# Patient Record
Sex: Female | Born: 1935 | Race: White | Hispanic: No | Smoking: Never smoker
Health system: Southern US, Community
[De-identification: ages and names within clinical notes are randomized; demographics above are authoritative.]

## PROBLEM LIST (undated history)

## (undated) DIAGNOSIS — I10 Essential (primary) hypertension: Secondary | ICD-10-CM

## (undated) DIAGNOSIS — E039 Hypothyroidism, unspecified: Secondary | ICD-10-CM

## (undated) DIAGNOSIS — F329 Major depressive disorder, single episode, unspecified: Secondary | ICD-10-CM

## (undated) DIAGNOSIS — F32A Depression, unspecified: Secondary | ICD-10-CM

## (undated) DIAGNOSIS — F419 Anxiety disorder, unspecified: Secondary | ICD-10-CM

## (undated) DIAGNOSIS — M1990 Unspecified osteoarthritis, unspecified site: Secondary | ICD-10-CM

## (undated) DIAGNOSIS — K219 Gastro-esophageal reflux disease without esophagitis: Secondary | ICD-10-CM

## (undated) DIAGNOSIS — G4730 Sleep apnea, unspecified: Secondary | ICD-10-CM

## (undated) DIAGNOSIS — Z5189 Encounter for other specified aftercare: Secondary | ICD-10-CM

## (undated) HISTORY — PX: CHOLECYSTECTOMY: SHX55

## (undated) HISTORY — PX: EYE SURGERY: SHX253

## (undated) HISTORY — PX: ABDOMINAL HYSTERECTOMY: SHX81

## (undated) HISTORY — PX: HERNIA REPAIR: SHX51

## (undated) HISTORY — PX: OTHER SURGICAL HISTORY: SHX169

## (undated) HISTORY — PX: TUBAL LIGATION: SHX77

---
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
## 1991-07-12 HISTORY — PX: OTHER SURGICAL HISTORY: SHX169

## 1993-09-07 HISTORY — PX: OTHER SURGICAL HISTORY: SHX169

## 1998-05-28 ENCOUNTER — Other Ambulatory Visit: Admission: RE | Admit: 1998-05-28 | Discharge: 1998-05-28 | Payer: Self-pay | Admitting: Gynecology

## 1999-07-07 ENCOUNTER — Other Ambulatory Visit: Admission: RE | Admit: 1999-07-07 | Discharge: 1999-07-07 | Payer: Self-pay | Admitting: Gynecology

## 2000-07-07 ENCOUNTER — Other Ambulatory Visit: Admission: RE | Admit: 2000-07-07 | Discharge: 2000-07-07 | Payer: Self-pay | Admitting: Gynecology

## 2001-07-09 ENCOUNTER — Other Ambulatory Visit: Admission: RE | Admit: 2001-07-09 | Discharge: 2001-07-09 | Payer: Self-pay | Admitting: Gynecology

## 2001-07-17 ENCOUNTER — Encounter: Payer: Self-pay | Admitting: Gynecology

## 2001-07-17 ENCOUNTER — Encounter: Admission: RE | Admit: 2001-07-17 | Discharge: 2001-07-17 | Payer: Self-pay | Admitting: Gynecology

## 2002-05-04 ENCOUNTER — Encounter: Payer: Self-pay | Admitting: Orthopedic Surgery

## 2002-05-04 ENCOUNTER — Encounter: Admission: RE | Admit: 2002-05-04 | Discharge: 2002-05-04 | Payer: Self-pay | Admitting: Orthopedic Surgery

## 2003-08-19 ENCOUNTER — Other Ambulatory Visit: Admission: RE | Admit: 2003-08-19 | Discharge: 2003-08-19 | Payer: Self-pay | Admitting: Gynecology

## 2003-09-05 ENCOUNTER — Encounter: Admission: RE | Admit: 2003-09-05 | Discharge: 2003-09-05 | Payer: Self-pay | Admitting: Gastroenterology

## 2003-09-09 ENCOUNTER — Encounter: Admission: RE | Admit: 2003-09-09 | Discharge: 2003-09-09 | Payer: Self-pay | Admitting: Gastroenterology

## 2004-01-09 ENCOUNTER — Inpatient Hospital Stay (HOSPITAL_COMMUNITY): Admission: RE | Admit: 2004-01-09 | Discharge: 2004-01-12 | Payer: Self-pay | Admitting: Surgery

## 2004-01-09 HISTORY — PX: HIATAL HERNIA REPAIR: SHX195

## 2004-03-30 ENCOUNTER — Encounter: Admission: RE | Admit: 2004-03-30 | Discharge: 2004-03-30 | Payer: Self-pay | Admitting: Neurosurgery

## 2004-07-02 ENCOUNTER — Encounter: Admission: RE | Admit: 2004-07-02 | Discharge: 2004-07-02 | Payer: Self-pay | Admitting: Neurosurgery

## 2004-08-23 ENCOUNTER — Encounter: Admission: RE | Admit: 2004-08-23 | Discharge: 2004-08-23 | Payer: Self-pay | Admitting: Neurosurgery

## 2004-09-07 ENCOUNTER — Encounter: Admission: RE | Admit: 2004-09-07 | Discharge: 2004-09-07 | Payer: Self-pay | Admitting: Neurosurgery

## 2004-09-08 ENCOUNTER — Encounter: Admission: RE | Admit: 2004-09-08 | Discharge: 2004-09-08 | Payer: Self-pay | Admitting: Gynecology

## 2004-09-21 ENCOUNTER — Encounter: Admission: RE | Admit: 2004-09-21 | Discharge: 2004-09-21 | Payer: Self-pay | Admitting: Neurosurgery

## 2004-09-22 ENCOUNTER — Encounter: Admission: RE | Admit: 2004-09-22 | Discharge: 2004-09-22 | Payer: Self-pay | Admitting: Gynecology

## 2004-10-30 ENCOUNTER — Emergency Department (HOSPITAL_COMMUNITY): Admission: EM | Admit: 2004-10-30 | Discharge: 2004-10-30 | Payer: Self-pay | Admitting: Emergency Medicine

## 2004-11-08 ENCOUNTER — Encounter: Admission: RE | Admit: 2004-11-08 | Discharge: 2004-11-08 | Payer: Self-pay | Admitting: Neurosurgery

## 2005-03-23 ENCOUNTER — Encounter: Admission: RE | Admit: 2005-03-23 | Discharge: 2005-03-23 | Payer: Self-pay | Admitting: Gynecology

## 2005-05-27 HISTORY — PX: BACK SURGERY: SHX140

## 2005-06-28 ENCOUNTER — Encounter: Admission: RE | Admit: 2005-06-28 | Discharge: 2005-07-21 | Payer: Self-pay | Admitting: *Deleted

## 2005-07-22 ENCOUNTER — Encounter: Admission: RE | Admit: 2005-07-22 | Discharge: 2005-08-16 | Payer: Self-pay | Admitting: *Deleted

## 2005-08-17 ENCOUNTER — Encounter: Admission: RE | Admit: 2005-08-17 | Discharge: 2005-09-13 | Payer: Self-pay | Admitting: *Deleted

## 2005-09-14 ENCOUNTER — Encounter: Admission: RE | Admit: 2005-09-14 | Discharge: 2005-10-04 | Payer: Self-pay | Admitting: *Deleted

## 2005-11-02 ENCOUNTER — Other Ambulatory Visit: Admission: RE | Admit: 2005-11-02 | Discharge: 2005-11-02 | Payer: Self-pay | Admitting: Gynecology

## 2005-11-08 ENCOUNTER — Encounter: Admission: RE | Admit: 2005-11-08 | Discharge: 2005-11-08 | Payer: Self-pay | Admitting: Gynecology

## 2006-05-10 ENCOUNTER — Encounter: Admission: RE | Admit: 2006-05-10 | Discharge: 2006-05-10 | Payer: Self-pay | Admitting: Gynecology

## 2006-08-09 IMAGING — CR DG LUMBAR SPINE COMPLETE 4+V
5 series · 5 of 5 positions shown · non-contrast
Comparison: 03/30/04.

CLINICAL DATA: Lumbar pain.  Assess for possible fracture at L-2.
 LUMBAR SPINE ? 4 VIEW:

[view not recorded (1 of 5)]
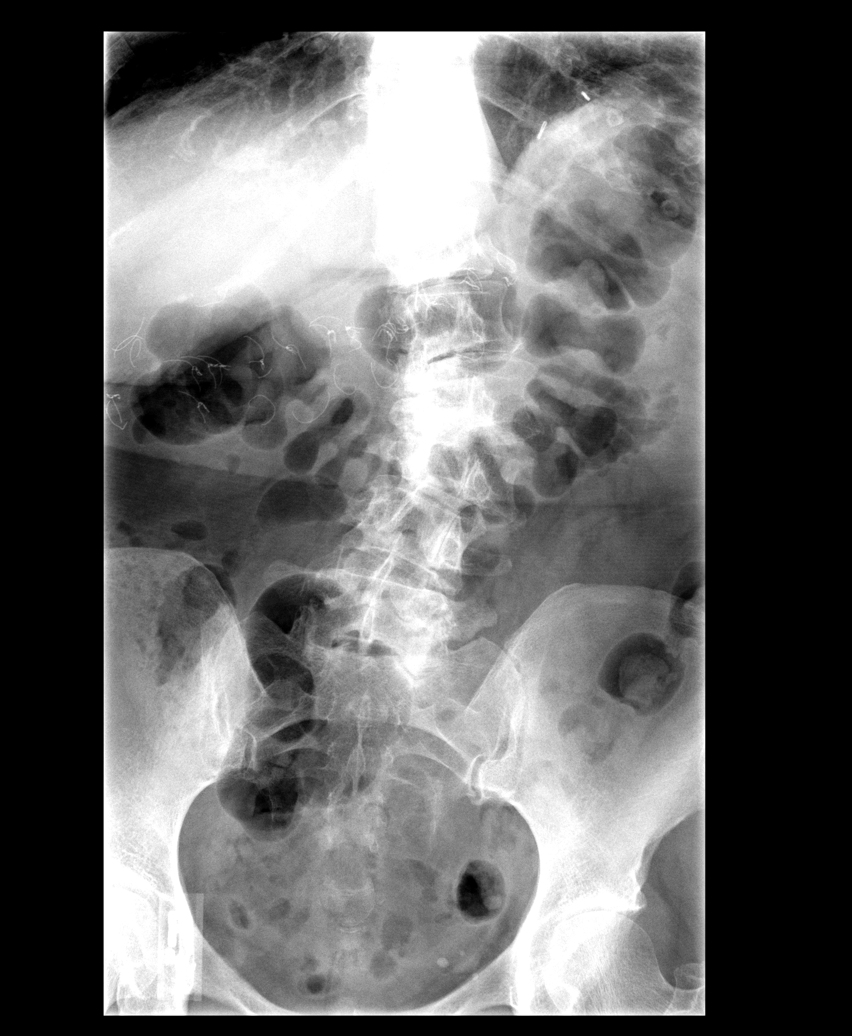

[view not recorded (2 of 5)]
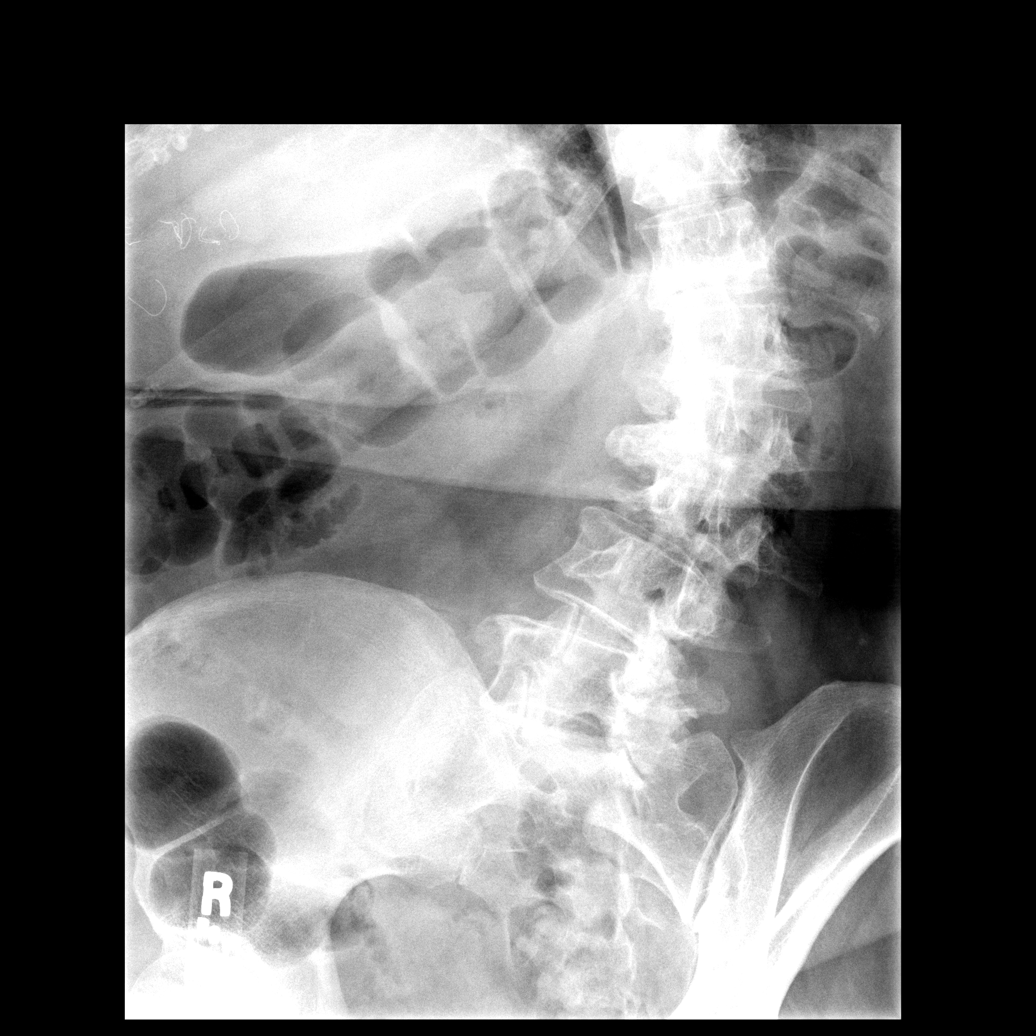

[view not recorded (3 of 5)]
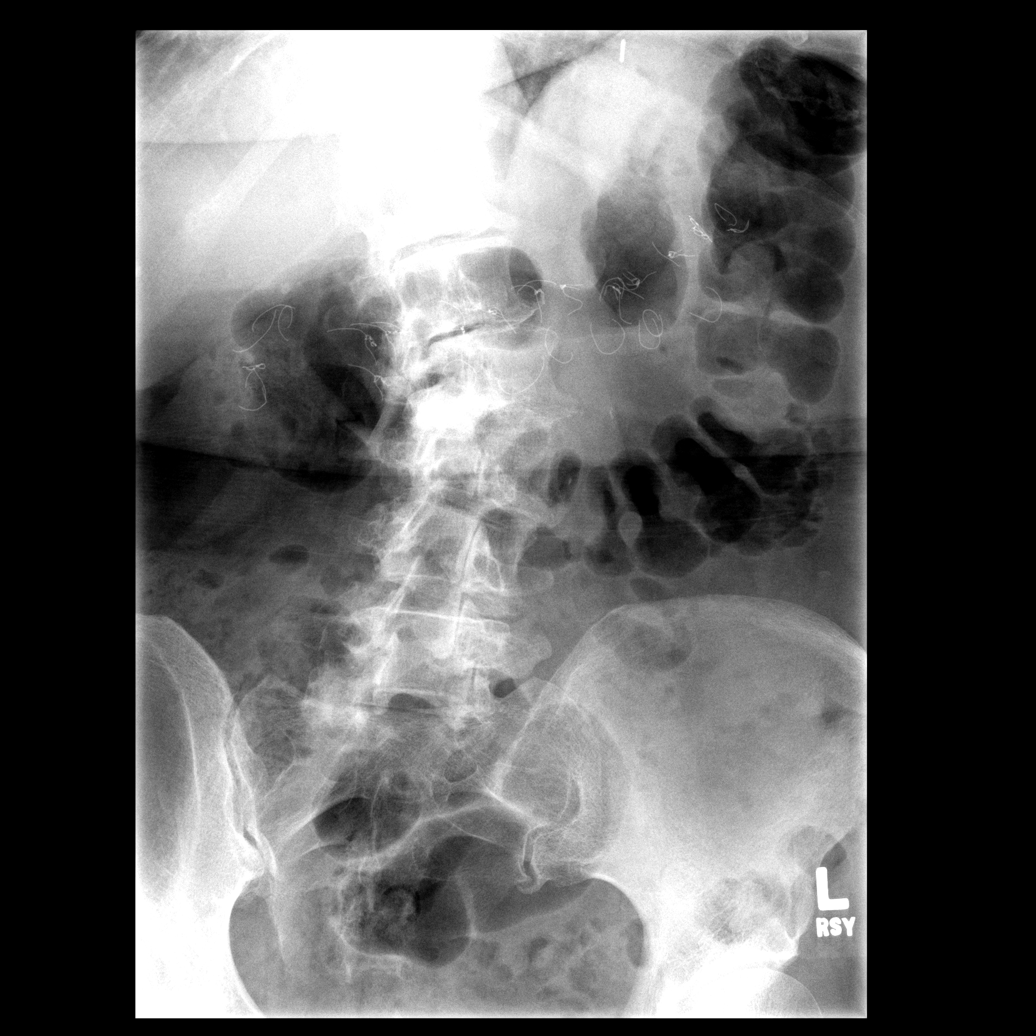

[view not recorded (4 of 5)]
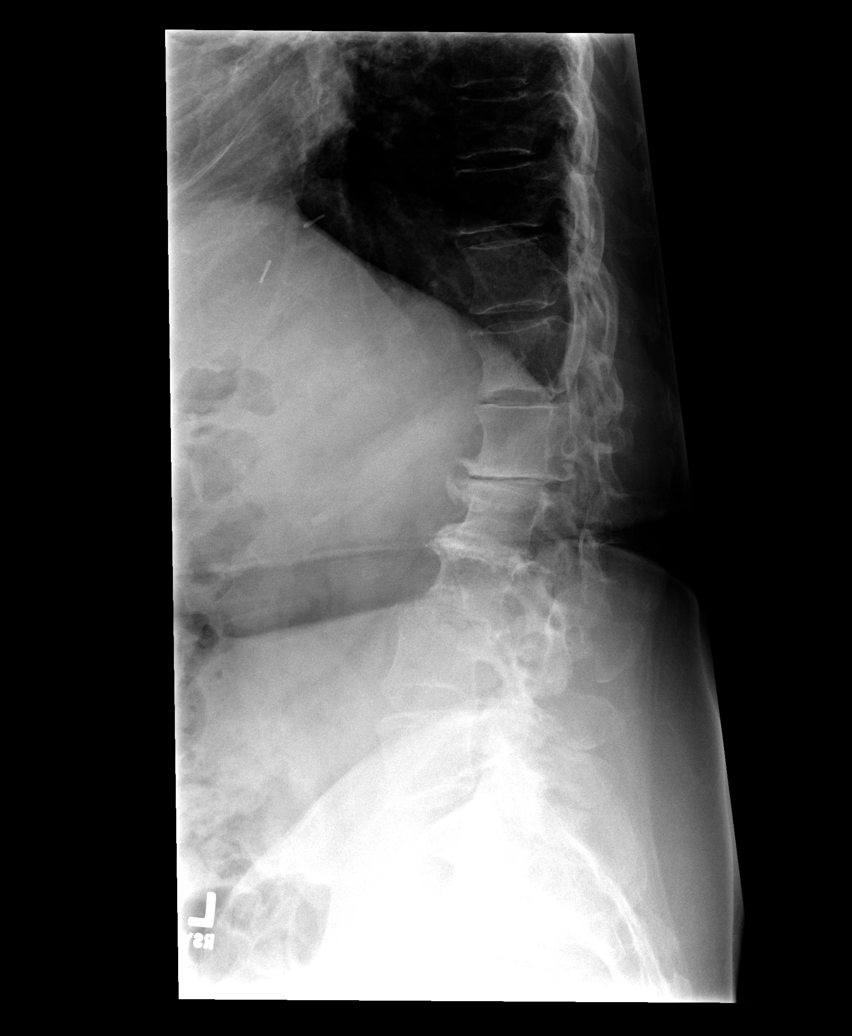

[view not recorded (5 of 5)]
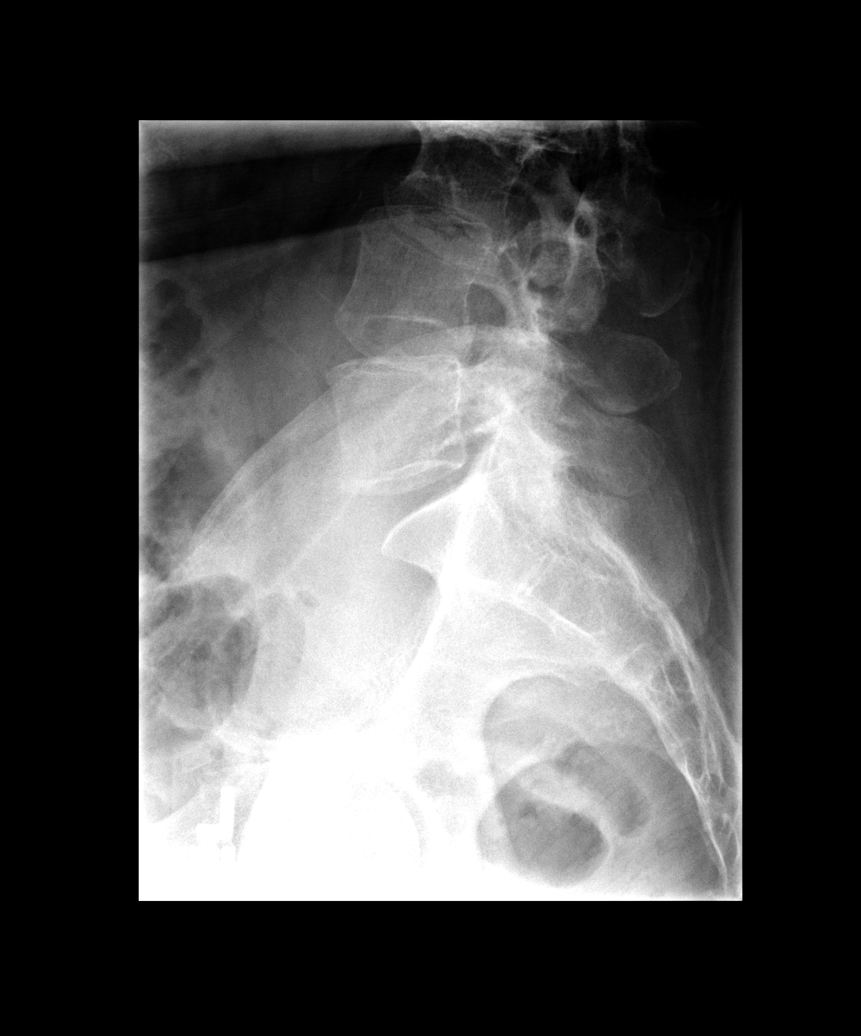

[5 of 5 positions shown; findings below may reference images not displayed]

FINDINGS: There is scoliosis of the thoracolumbar spine convex to the left with the apex at L-2.  There is degenerative disc disease at T12-L1, L1-2, L2-3 and L3-4 with loss of disc space particularly on the right at L1-2 and L2-3 because of the scoliosis.  I do not see that there is an actual fracture.  In the lower lumbar spine, degenerative changes are present of a lesser degree, more pronounced on the left because of the curvature.
IMPRESSION: No fracture demonstrated.  Thoracolumbar scoliosis with pronounced degenerative changes on the right at L1-2 and L2-3.  See above for full discussion.

## 2006-10-06 ENCOUNTER — Ambulatory Visit: Payer: Self-pay | Admitting: Gastroenterology

## 2006-10-18 ENCOUNTER — Encounter: Payer: Self-pay | Admitting: Gastroenterology

## 2006-10-18 LAB — CONVERTED CEMR LAB
Fecal Occult Blood: NEGATIVE
OCCULT 1: NEGATIVE
OCCULT 2: NEGATIVE
OCCULT 3: NEGATIVE
OCCULT 5: NEGATIVE

## 2006-11-01 ENCOUNTER — Ambulatory Visit: Payer: Self-pay | Admitting: Gastroenterology

## 2006-11-01 DIAGNOSIS — K449 Diaphragmatic hernia without obstruction or gangrene: Secondary | ICD-10-CM | POA: Insufficient documentation

## 2006-11-01 DIAGNOSIS — K644 Residual hemorrhoidal skin tags: Secondary | ICD-10-CM | POA: Insufficient documentation

## 2006-11-01 DIAGNOSIS — K219 Gastro-esophageal reflux disease without esophagitis: Secondary | ICD-10-CM

## 2006-11-01 DIAGNOSIS — K5730 Diverticulosis of large intestine without perforation or abscess without bleeding: Secondary | ICD-10-CM | POA: Insufficient documentation

## 2006-11-14 ENCOUNTER — Encounter: Admission: RE | Admit: 2006-11-14 | Discharge: 2006-11-14 | Payer: Self-pay | Admitting: Internal Medicine

## 2006-12-25 ENCOUNTER — Ambulatory Visit (HOSPITAL_COMMUNITY): Admission: RE | Admit: 2006-12-25 | Discharge: 2006-12-25 | Payer: Self-pay | Admitting: Internal Medicine

## 2007-03-23 ENCOUNTER — Encounter (HOSPITAL_COMMUNITY): Admission: RE | Admit: 2007-03-23 | Discharge: 2007-06-21 | Payer: Self-pay | Admitting: Internal Medicine

## 2007-03-23 DIAGNOSIS — K222 Esophageal obstruction: Secondary | ICD-10-CM

## 2007-03-23 DIAGNOSIS — R1310 Dysphagia, unspecified: Secondary | ICD-10-CM | POA: Insufficient documentation

## 2007-03-23 DIAGNOSIS — R1013 Epigastric pain: Secondary | ICD-10-CM

## 2007-06-25 ENCOUNTER — Encounter (HOSPITAL_COMMUNITY): Admission: RE | Admit: 2007-06-25 | Discharge: 2007-09-23 | Payer: Self-pay | Admitting: Internal Medicine

## 2007-10-10 ENCOUNTER — Encounter (HOSPITAL_COMMUNITY): Admission: RE | Admit: 2007-10-10 | Discharge: 2008-01-08 | Payer: Self-pay | Admitting: Internal Medicine

## 2008-01-11 DIAGNOSIS — Z5189 Encounter for other specified aftercare: Secondary | ICD-10-CM

## 2008-01-11 DIAGNOSIS — IMO0001 Reserved for inherently not codable concepts without codable children: Secondary | ICD-10-CM

## 2008-01-11 HISTORY — PX: BREAST SURGERY: SHX581

## 2008-01-11 HISTORY — DX: Reserved for inherently not codable concepts without codable children: IMO0001

## 2008-01-11 HISTORY — DX: Encounter for other specified aftercare: Z51.89

## 2008-02-01 ENCOUNTER — Encounter: Admission: RE | Admit: 2008-02-01 | Discharge: 2008-02-01 | Payer: Self-pay | Admitting: Gynecology

## 2008-02-15 ENCOUNTER — Encounter: Payer: Self-pay | Admitting: Gastroenterology

## 2008-02-20 ENCOUNTER — Encounter: Payer: Self-pay | Admitting: Gastroenterology

## 2008-03-11 ENCOUNTER — Telehealth: Payer: Self-pay | Admitting: Gastroenterology

## 2008-03-12 ENCOUNTER — Encounter: Payer: Self-pay | Admitting: Nurse Practitioner

## 2008-03-12 ENCOUNTER — Ambulatory Visit: Payer: Self-pay | Admitting: Gastroenterology

## 2008-03-12 ENCOUNTER — Encounter: Payer: Self-pay | Admitting: Gastroenterology

## 2008-03-12 DIAGNOSIS — K5900 Constipation, unspecified: Secondary | ICD-10-CM | POA: Insufficient documentation

## 2008-03-12 DIAGNOSIS — I10 Essential (primary) hypertension: Secondary | ICD-10-CM | POA: Insufficient documentation

## 2008-03-12 DIAGNOSIS — J449 Chronic obstructive pulmonary disease, unspecified: Secondary | ICD-10-CM

## 2008-03-12 DIAGNOSIS — R1319 Other dysphagia: Secondary | ICD-10-CM | POA: Insufficient documentation

## 2008-03-14 ENCOUNTER — Encounter: Payer: Self-pay | Admitting: Gastroenterology

## 2008-03-17 ENCOUNTER — Ambulatory Visit: Payer: Self-pay | Admitting: Gastroenterology

## 2008-03-18 ENCOUNTER — Encounter: Payer: Self-pay | Admitting: Gastroenterology

## 2008-03-25 ENCOUNTER — Encounter: Payer: Self-pay | Admitting: Gastroenterology

## 2008-04-01 ENCOUNTER — Encounter (HOSPITAL_COMMUNITY): Admission: RE | Admit: 2008-04-01 | Discharge: 2008-05-15 | Payer: Self-pay | Admitting: Internal Medicine

## 2008-04-14 ENCOUNTER — Encounter: Admission: RE | Admit: 2008-04-14 | Discharge: 2008-04-14 | Payer: Self-pay | Admitting: Surgery

## 2008-04-15 ENCOUNTER — Encounter (INDEPENDENT_AMBULATORY_CARE_PROVIDER_SITE_OTHER): Payer: Self-pay | Admitting: Surgery

## 2008-04-15 ENCOUNTER — Ambulatory Visit (HOSPITAL_BASED_OUTPATIENT_CLINIC_OR_DEPARTMENT_OTHER): Admission: RE | Admit: 2008-04-15 | Discharge: 2008-04-15 | Payer: Self-pay | Admitting: Surgery

## 2008-04-29 ENCOUNTER — Ambulatory Visit: Payer: Self-pay | Admitting: Gastroenterology

## 2008-05-07 ENCOUNTER — Ambulatory Visit (HOSPITAL_COMMUNITY): Admission: RE | Admit: 2008-05-07 | Discharge: 2008-05-07 | Payer: Self-pay | Admitting: Gastroenterology

## 2008-05-07 ENCOUNTER — Encounter: Payer: Self-pay | Admitting: Gastroenterology

## 2008-05-08 ENCOUNTER — Telehealth: Payer: Self-pay | Admitting: Gastroenterology

## 2008-06-18 ENCOUNTER — Ambulatory Visit: Payer: Self-pay | Admitting: Gastroenterology

## 2008-06-24 ENCOUNTER — Encounter (HOSPITAL_COMMUNITY): Admission: RE | Admit: 2008-06-24 | Discharge: 2008-09-22 | Payer: Self-pay | Admitting: Internal Medicine

## 2008-09-22 ENCOUNTER — Encounter (HOSPITAL_COMMUNITY): Admission: RE | Admit: 2008-09-22 | Discharge: 2008-10-09 | Payer: Self-pay | Admitting: Internal Medicine

## 2008-10-02 ENCOUNTER — Encounter: Payer: Self-pay | Admitting: Gastroenterology

## 2008-12-23 ENCOUNTER — Encounter (HOSPITAL_COMMUNITY): Admission: RE | Admit: 2008-12-23 | Discharge: 2009-01-08 | Payer: Self-pay | Admitting: Internal Medicine

## 2009-02-03 ENCOUNTER — Encounter: Admission: RE | Admit: 2009-02-03 | Discharge: 2009-02-03 | Payer: Self-pay | Admitting: Gynecology

## 2009-03-16 ENCOUNTER — Encounter (HOSPITAL_COMMUNITY): Admission: RE | Admit: 2009-03-16 | Discharge: 2009-06-14 | Payer: Self-pay | Admitting: Internal Medicine

## 2010-01-31 ENCOUNTER — Encounter: Payer: Self-pay | Admitting: Gynecology

## 2010-04-21 LAB — BASIC METABOLIC PANEL
BUN: 9 mg/dL (ref 6–23)
CO2: 28 mEq/L (ref 19–32)
Calcium: 10 mg/dL (ref 8.4–10.5)
GFR calc non Af Amer: >60 mL/min (ref >60)
Glucose, Bld: 115 mg/dL — ABNORMAL HIGH (ref 70–99)
Potassium: 4.4 mEq/L (ref 3.5–5.1)

## 2010-05-25 NOTE — Assessment & Plan Note (Signed)
[name]                         GASTROENTEROLOGY OFFICE NOTE   NAME:Meghan Sexton, Meghan Sexton                     MRN:          [id number]  DATE:10/06/2006                            DOB:          13-Aug-1923    This is an 77 year old white female previously followed by Dr. Victorino Dike with a long history of recurrent esophageal strictures.  She has  had multiple dilations over the years.  She underwent repair of a large  paraesophageal hernia in December, 2005.  She has not had a repeat  endoscopy since that time.   Over the past six months, she has noted dysphagia and odynophagia to  both solids and liquids with occasional epigastric cramping.  She has  noted dark, black-colored stools for several weeks.  She was seen by Dr.  Timothy Lasso and was prescribed Prevacid for about two weeks, and her stools  returned to a normal brown color.  CBC from September 21, 2006 showed a  normal hemoglobin at 11.3 and a white blood cell count of 7.4.   Her last colonoscopy was performed in 1997, showing only diverticulosis.  She notes no change in stool caliber or weight loss.  She takes Actonel  on a weekly basis.   FAMILY HISTORY:  Negative for colon cancer, colon polyps, and  inflammatory bowel disease.   CURRENT MEDICATIONS:  Listed on the chart, updated and reviewed.   MEDICATION ALLERGIES:  PENICILLIN, SULFA DRUGS.   PHYSICAL EXAMINATION:  No acute distress.  Weight 145.6 pounds.  Blood pressure 138/66.  Pulse 72 and regular.  HEENT:  Anicteric sclerae.  Oropharynx clear.  CHEST:  Clear to auscultation bilaterally.  CARDIAC:  Regular rate and rhythm without murmurs appreciated.  ABDOMEN:  Soft with minimal epigastric tenderness to deep palpation.  No  rebound or guarding.  No palpable organomegaly, masses, or hernias.  Normoactive bowel sounds.  RECTAL:  Patient declined, saying she would prefer to have this done at  the time of colonoscopy.  NEUROLOGIC:   Alert and oriented x3.  Grossly nonfocal.   ASSESSMENT/PLAN:  Dysphagia, odynophagia, epigastric pain, and a  questionable history of melena.  Rule out strictures and esophagitis  secondary to acid reflux or Actonel.  Rule out ulcer disease and  colorectal lesions leading to bleeding.  She is to discontinue Actonel  and begin Prevacid 30 mg p.o. q.a.m.  Obtain stool Hemoccults.  Risks,  benefits and alternatives to upper endoscopy with possible biopsy and  possible dilation and colonoscopy with possible biopsy and  possible polypectomy discussed with the patient, and she consents to  proceed.  The procedures will be scheduled electively.     Venita Lick. Russella Dar, MD, [facility]  Electronically Signed    MTS/MedQ  DD: 10/06/2006  DT: 10/07/2006  Job #: [id number]   cc:   Gwen Pounds, MD

## 2010-05-25 NOTE — Op Note (Signed)
Meghan Sexton, Meghan Sexton              ACCOUNT NO.:  [id number]   MEDICAL RECORD NO.:  [id number]          PATIENT TYPE:  AMB   LOCATION:  DSC                          FACILITY:  [facility]   PHYSICIAN:  Currie Paris, M.D.DATE OF BIRTH:  Nov 29, 1923   DATE OF PROCEDURE:  04/15/2008  DATE OF DISCHARGE:                               OPERATIVE REPORT   PREOPERATIVE DIAGNOSIS:  Palpable mass, right breast subareolar.   POSTOPERATIVE DIAGNOSIS:  Palpable mass, right breast subareolar.   OPERATION:  Excision of right breast mass.   SURGEON:  Currie Paris, MD   ANESTHESIA:  MAC.   CLINICAL HISTORY:  This is an 84-year lady with palpable abnormalities  that developed recently in the right breast subareolarly.  It was not  visible on imaging studies and felt likely benign but clearly different  than what it had been in the past by her primary physicians.  After  discussion with the patient, we elected to excise this under MAC  anesthesia.   DESCRIPTION OF PROCEDURE:  I saw the patient in the holding area, and  she had no further questions.  We confirmed and marked the right breast  as the operative side.   The patient was taken to the operating room and with her position, I was  able to palpate the exact mass and mark it.  She was then given IV  sedation and the breast was prepped and draped.  The time-out was done.   I used a combination of 0.5% Marcaine with epi and 1% Xylocaine plain  mixed equally for local and infiltrated along the edge of the areola,  subareolarly, and under the mass.  I made a curvilinear incision at the  areolar margin.   There was fatty tissue present but also some dense white fibrous tissue  which represented the palpable mass.  Clinically, it appeared to be some  residual breast tissue.  I excised that in toto.   I made sure everything was dry and then closed with 3-0 Vicryl, 4-0  Monocryl subcuticular, and Dermabond.   The patient tolerated the  procedure well, and there were no  complications.  All counts were correct.      Currie Paris, M.D.  Electronically Signed     CJS/MEDQ  D:  04/15/2008  T:  04/15/2008  Job:  [id number]   cc:   Gwen Pounds, MD  Gretta Cool, M.D.

## 2010-05-28 NOTE — Op Note (Signed)
NAMEROCHELLE, LARUE              ACCOUNT NO.:  [id number]   MEDICAL RECORD NO.:  [id number]          PATIENT TYPE:  INP   LOCATION:  0001                         FACILITY:  [facility]   PHYSICIAN:  Thornton Park. Daphine Deutscher, MD  DATE OF BIRTH:  04-02-1923   DATE OF PROCEDURE:  01/09/2004  DATE OF DISCHARGE:                                 OPERATIVE REPORT   PREOPERATIVE INDICATIONS:  Type 3 mixed large hiatal hernia.   PROCEDURE:  Laparoscopic repair of large type 3 mixed hiatal hernia with  pledgeted closure of the diaphragm and with Nissen fundoplication over a #50  dilator.   SURGEON:  Luretha Murphy, M.D.   ASSISTANT:  Gita Kudo, M.D.   ANESTHESIA:  General endotracheal anesthesia.   ESTIMATED BLOOD LOSS:  50 mL.   COMPLICATIONS:  Without.   DISPOSITION:  To ICU stepdown.   DESCRIPTION OF PROCEDURE:  Dunia Pringle is a 77 year old lady given  general anesthesia and prepped with Betadine and draped sterilely.  A Foley  catheter was inserted and entered the abdomen through the left upper  quadrant using a 0 degree 10 mm scope and Optiview technique.  This was done  without difficulty and the abdomen was insufflated.  Following insufflation,  two 10-11 were placed over in the right upper quadrant after it took down a  numerous adhesions from a previous old open cholecystectomy and she had a  lower midline incision with ventral hernia repair.  A 10-11 was placed below  the umbilicus and then a 5 mm in the upper midline for the articulating  retractor for the liver.  Once in, the liver was retracted revealing a large  hiatal hernia with the stomach up in the chest.  I grasped the stomach,  pulled it down and then using the harmonic staple dissected both the right  and left pruce and dissected the hernia sac off with freeing up the stomach  from this.  I carried this anteriorly and freed this and then I got Dr.  Rica Mast to pass a lighted bougie to help identify the esophagus  and  esophagogastric junction and with this I was able to do a retroesophageal  dissection eventually getting the large finger retractor around the  esophagus and the Penrose drain for subsequent retraction.  With that in  place, we completed the dissection of the esophagogastric junction.  I did a  three suture pledgeted closure of the pleura posteriorly eventually placing  single suture anteriorly.  In the meantime with the Bougie in place, this  completely seemed to close the diaphragm.  With the bougie in place, I then  reached around behind the esophagus and grasped the cardia, brought it  around and then invaginated the esophagus with the wrap of the cardia.  A  two suture fundoplication was performed getting the purchase of esophagus on  the first bite anchoring it to the esophagus, tying this down with an  intracorporeal tie and a second suture also grabbing at the esophagogastric  junction and tying this as well.  There was no enough stomach to perform a  gastropexy distally  and elected not to do that as that would not distort the  stomach and the anatomy significantly.  However, the closure looked good and  the wrap looked good and healthy.  There was essentially no bleeding.  I did  take down the short gastrics of the harmonic.  No bleeding was noted there.  Where I had taken down the adhesions before, I looked and irrigated that as  well and there was no evidence of any enterotomies or bleeding.   The abdomen was deflated and the trocars were removed.  The wounds were  closed with staples.  The patient will be taken to the ICU for observation  mainly because of her 77 year old age.  +     Matt   MBM/MEDQ  D:  01/09/2004  T:  01/09/2004  Job:  [id number]   cc:   Gwen Pounds, MD  Fax: [phone]   Ulyess Mort, M.D. [facility]

## 2010-05-28 NOTE — Consult Note (Signed)
NAMEPLESHETTE, TOMASINI NO.:  [id number]   MEDICAL RECORD NO.:  [id number]          PATIENT TYPE:  INP   LOCATION:  8295                         FACILITY:  [facility]   PHYSICIAN:  Kari Baars, M.D.  DATE OF BIRTH:  July 14, 1923   DATE OF CONSULTATION:  DATE OF DISCHARGE:                                   CONSULTATION   PRIMARY CARE PHYSICIAN:  Gwen Pounds, MD   REQUESTING PHYSICIAN:  Thornton Park. Daphine Deutscher, MD   REASON FOR CONSULTATION:  Medical management, tachy/brady rhythm, post  hiatal hernia repair.   HISTORY OF PRESENT ILLNESS:  Meghan Sexton is a 77 year old white female  patient of Dr. Ferd Hibbs with a history of hypertension, hyperlipidemia, and  hypothyroidism, who underwent a laparoscopic repair of a large hiatal hernia  due to severe reflux symptoms and esophageal motility problems earlier this  morning.  Evidently, postoperatively she had a tachy/brady rhythm.  Recorded  heart rates are in the 60s to 120s, per the anesthesia record.  She has been  transferred to the intensive care unit due to her rhythm disturbance and for  monitoring.  Since admission here, she has had no bradycardia or tachycardia  noted.  In talking with the patient, she is doing well postoperatively.  She  denies any problems.  Her pain is adequately controlled.  She does report a  history of palpitations intermittently in the past, which was related, she  thinks, to glucosamine use.  She has not had any since that time.  She  denies any history of dysrhythmias.  She denies any recent chest pain,  shortness of breath, dyspnea on exertion.  Otherwise, she is doing well.   REVIEW OF SYSTEMS:  All systems were reviewed and were negative with the  exception of the HPI.   PAST MEDICAL HISTORY:  1.  Hypertension.  2.  Hypothyroidism.  3.  Hyperlipidemia.  4.  Osteoporosis.  5.  Gastroesophageal reflux disease/hiatal hernia with esophageal stricture      requiring dilatation.  6.   Asthma.   HOME MEDICATIONS:  1.  Prilosec 40 mg daily.  2.  Benazepril/HCT 20/12.5 mg daily.  3.  Synthroid 112 mcg daily.  4.  Vivelle 0.05 mg patch 2 times a week.  5.  Desyrel 50 mg q.h.s.  6.  Ultram 50 mg b.i.d.  7.  Actonel 35 mg weekly.  8.  Advair MDI b.i.d.  9.  Atrovent p.r.n.  10. Valium 5 mg 1/2 q.h.s.  11. Lasix 40 mg 1/2 p.r.n.   ALLERGIES:  1.  PENICILLIN.  2.  CODEINE.  3.  SULFA.   SOCIAL HISTORY:  She is a widow.  She does not drink, smoke, or use illicit  drugs.   FAMILY HISTORY:  Negative for coronary artery disease.   PHYSICAL EXAMINATION:  VITAL SIGNS:  Temperature 96.8, blood pressure 90-  112/32-70, heart rate 58-80, oxygen saturation 96% on 2 liters.  GENERAL:  She is in no acute distress, resting comfortably in a supine  position.  HEENT:  Oropharynx is moist.  NECK:  Supple without lymphadenopathy, JVD, or carotid  bruits.  LUNGS:  Clear to auscultation bilaterally.  HEART:  Regular rate and rhythm with a grade 2/6 systolic ejection murmur.  ABDOMEN:  Soft, nontender, nondistended with decreased bowel sounds, which  are present.  She has bandages over her laparoscopic portals.  EXTREMITIES:  No clubbing, cyanosis or edema.  NEUROLOGIC:  Alert and oriented x 4.   LABS:  CBC showed a white count of 11.0, hemoglobin 10.3, platelets 217.  BMET was significant for a potassium of 3.6, bicarb 31, creatinine 0.7, and  a glucose of 143.  Of note, these were obtained postoperatively.   STUDIES:  EKG was reviewed and shows a normal sinus rhythm with nonspecific  ST/T wave changes.   ASSESSMENT/PLAN:  1.  Transient tachycardia/bradycardia rhythm:  This is likely related to her      anesthetics and perhaps pain.  Will monitor her for recurrence on      telemetry.  Will check a TSH.  She has not had any other cardiac      symptoms.  2.  Hypertension:  Hold antihypertensives until her blood pressure increases      to a normal range.  This is currently  decreased due to the narcotics.  3.  Hypothyroidism:  Restart her Synthroid.  Check a TSH.  4.  Asthma:  Continue incentive spirometry and pulmonary toilet following      her surgery.  Will restart her Advair and Atrovent.   I appreciate management of the surgeon in Ms. Doerner' case.  We will gladly  follow along with you during her hospital course and monitor her for any  medical complications following her surgery.  At this time, she seems to be  doing very well.     Margarito Liner   WS/MEDQ  D:  01/09/2004  T:  01/09/2004  Job:  [id number]   cc:   Gwen Pounds, MD  Fax: [phone]

## 2010-05-28 NOTE — Discharge Summary (Signed)
NAMESAI, MOURA              ACCOUNT NO.:  [id number]   MEDICAL RECORD NO.:  [id number]          PATIENT TYPE:  INP   LOCATION:  0463                         FACILITY:  [facility]   PHYSICIAN:  Thornton Park. Daphine Deutscher, MD  DATE OF BIRTH:  [birth year]   DATE OF ADMISSION:  01/09/2004  DATE OF DISCHARGE:  01/12/2004                                 DISCHARGE SUMMARY   ADMISSION DIAGNOSES:  Large paraesophageal hiatal hernia (type 3 mixed).   DISCHARGE DIAGNOSES:  Large paraesophageal hiatal hernia (type 3 mixed).   PROCEDURE:  Laparoscopic repair of large type 3 mixed hiatal hernia with  pledget closure of the diaphragm and Nissen fundoplication over a 50  dilator.   HOSPITAL COURSE:  The patient underwent the above-mentioned operation.  Despite being 77 years old, she did very well.  This was done on 12/30 and  she was ready to go on 1/2.  At that time she was tolerating liquids with a  little pain and having good bowel movements and was ready for discharge.  She was given Darvocet to take as needed for pain and be followed up in the  office in 7-12 days.   CONDITION ON DISCHARGE:  Good.     Matt   MBM/MEDQ  D:  01/28/2004  T:  01/28/2004  Job:  [id number]   cc:   Gwen Pounds, MD  Fax: [phone]   Ulyess Mort, M.D. [facility]

## 2010-10-06 ENCOUNTER — Ambulatory Visit: Payer: Medicare Other | Attending: Internal Medicine | Admitting: Physical Therapy

## 2010-10-06 DIAGNOSIS — R269 Unspecified abnormalities of gait and mobility: Secondary | ICD-10-CM | POA: Insufficient documentation

## 2010-10-06 DIAGNOSIS — M6281 Muscle weakness (generalized): Secondary | ICD-10-CM | POA: Insufficient documentation

## 2010-10-06 DIAGNOSIS — IMO0001 Reserved for inherently not codable concepts without codable children: Secondary | ICD-10-CM | POA: Insufficient documentation

## 2010-10-14 ENCOUNTER — Ambulatory Visit: Payer: Medicare Other | Attending: Internal Medicine | Admitting: Physical Therapy

## 2010-10-14 DIAGNOSIS — M6281 Muscle weakness (generalized): Secondary | ICD-10-CM | POA: Insufficient documentation

## 2010-10-14 DIAGNOSIS — R269 Unspecified abnormalities of gait and mobility: Secondary | ICD-10-CM | POA: Insufficient documentation

## 2010-10-14 DIAGNOSIS — IMO0001 Reserved for inherently not codable concepts without codable children: Secondary | ICD-10-CM | POA: Insufficient documentation

## 2010-10-15 ENCOUNTER — Ambulatory Visit: Payer: Medicare Other | Admitting: Physical Therapy

## 2010-10-18 ENCOUNTER — Ambulatory Visit: Payer: Medicare Other | Admitting: Physical Therapy

## 2010-10-20 ENCOUNTER — Ambulatory Visit: Payer: Medicare Other | Admitting: Physical Therapy

## 2010-10-25 ENCOUNTER — Encounter: Payer: BC Managed Care – PPO | Admitting: Physical Therapy

## 2010-10-28 ENCOUNTER — Ambulatory Visit: Payer: Medicare Other | Admitting: Physical Therapy

## 2010-11-02 ENCOUNTER — Ambulatory Visit: Payer: Medicare Other | Admitting: Physical Therapy

## 2010-11-04 ENCOUNTER — Ambulatory Visit: Payer: Medicare Other | Admitting: Physical Therapy

## 2010-11-08 ENCOUNTER — Ambulatory Visit: Payer: Medicare Other | Admitting: Physical Therapy

## 2010-11-11 ENCOUNTER — Ambulatory Visit: Payer: Medicare Other | Attending: Internal Medicine | Admitting: Physical Therapy

## 2010-11-11 DIAGNOSIS — M6281 Muscle weakness (generalized): Secondary | ICD-10-CM | POA: Insufficient documentation

## 2010-11-11 DIAGNOSIS — IMO0001 Reserved for inherently not codable concepts without codable children: Secondary | ICD-10-CM | POA: Insufficient documentation

## 2010-11-11 DIAGNOSIS — R269 Unspecified abnormalities of gait and mobility: Secondary | ICD-10-CM | POA: Insufficient documentation

## 2010-11-15 ENCOUNTER — Ambulatory Visit: Payer: Medicare Other | Admitting: Physical Therapy

## 2010-11-18 ENCOUNTER — Ambulatory Visit: Payer: Medicare Other | Admitting: Physical Therapy

## 2010-11-22 ENCOUNTER — Ambulatory Visit: Payer: Medicare Other | Admitting: Physical Therapy

## 2010-11-24 ENCOUNTER — Ambulatory Visit: Payer: BC Managed Care – PPO | Admitting: Physical Therapy

## 2010-11-29 ENCOUNTER — Ambulatory Visit: Payer: BC Managed Care – PPO | Admitting: Physical Therapy

## 2010-11-30 ENCOUNTER — Ambulatory Visit: Payer: BC Managed Care – PPO | Admitting: Physical Therapy

## 2010-12-06 ENCOUNTER — Ambulatory Visit: Payer: Medicare Other | Admitting: Physical Therapy

## 2010-12-09 ENCOUNTER — Ambulatory Visit: Payer: BC Managed Care – PPO | Admitting: Physical Therapy

## 2011-03-22 ENCOUNTER — Encounter (HOSPITAL_COMMUNITY): Payer: Self-pay | Admitting: Pharmacy Technician

## 2011-03-28 ENCOUNTER — Encounter (HOSPITAL_COMMUNITY)
Admission: RE | Admit: 2011-03-28 | Discharge: 2011-03-28 | Disposition: A | Discharge status: Home / Self Care | Payer: Medicare Other | Source: Ambulatory Visit | Attending: Orthopedic Surgery | Admitting: Orthopedic Surgery

## 2011-03-28 ENCOUNTER — Ambulatory Visit (HOSPITAL_COMMUNITY)
Admission: RE | Admit: 2011-03-28 | Discharge: 2011-03-28 | Disposition: A | Discharge status: Home / Self Care | Payer: Medicare Other | Source: Ambulatory Visit | Attending: Orthopedic Surgery | Admitting: Orthopedic Surgery

## 2011-03-28 ENCOUNTER — Encounter (HOSPITAL_COMMUNITY): Payer: Self-pay

## 2011-03-28 DIAGNOSIS — Z01812 Encounter for preprocedural laboratory examination: Secondary | ICD-10-CM | POA: Insufficient documentation

## 2011-03-28 DIAGNOSIS — Z01818 Encounter for other preprocedural examination: Secondary | ICD-10-CM | POA: Insufficient documentation

## 2011-03-28 HISTORY — DX: Hypothyroidism, unspecified: E03.9

## 2011-03-28 HISTORY — DX: Sleep apnea, unspecified: G47.30

## 2011-03-28 HISTORY — DX: Essential (primary) hypertension: I10

## 2011-03-28 HISTORY — DX: Encounter for other specified aftercare: Z51.89

## 2011-03-28 LAB — CBC
HCT: 36.7 % (ref 36.0–46.0)
Hemoglobin: 12.3 g/dL (ref 12.0–15.0)
MCH: 31.9 pg (ref 26.0–34.0)
MCHC: 33.5 g/dL (ref 30.0–36.0)
MCV: 95.3 fL (ref 78.0–100.0)

## 2011-03-28 LAB — BASIC METABOLIC PANEL
BUN: 20 mg/dL (ref 6–23)
CO2: 34 mEq/L — ABNORMAL HIGH (ref 19–32)
Chloride: 94 mEq/L — ABNORMAL LOW (ref 96–112)
Glucose, Bld: 104 mg/dL — ABNORMAL HIGH (ref 70–99)
Potassium: 3.9 mEq/L (ref 3.5–5.1)

## 2011-03-28 LAB — URINALYSIS, ROUTINE W REFLEX MICROSCOPIC
Bilirubin Urine: NEGATIVE
Glucose, UA: NEGATIVE mg/dL
Ketones, ur: NEGATIVE mg/dL
Protein, ur: NEGATIVE mg/dL
pH: 6 (ref 5.0–8.0)

## 2011-03-28 LAB — DIFFERENTIAL
Basophils Relative: 1 % (ref 0–1)
Eosinophils Relative: 4 % (ref 0–5)
Monocytes Absolute: 0.7 10*3/uL (ref 0.1–1.0)
Monocytes Relative: 7 % (ref 3–12)
Neutro Abs: 6.6 10*3/uL (ref 1.7–7.7)

## 2011-03-28 NOTE — Pre-Procedure Instructions (Signed)
ekg dr Timothy Lasso 03-15-2011 on chart stopbang results faxed to dr Timothy Lasso, fax confirmation received and placed on chartt

## 2011-03-28 NOTE — Patient Instructions (Addendum)
20 Meghan Sexton  03/28/2011   Your procedure is scheduled on:  04-04-11  Report to [facility] at  0730 AM.  Call this number if you have problems the morning of surgery: [phone]   Remember:   Do not eat food or drink liquids:After Midnight.  .  Take these medicines the morning of surgery with A SIP OF WATER: restasis eye drop, diazepam if needed, fentanyl patch, hydrocodone if needed, atrovent nasal spray, synthroid, pantaprazole   Do not wear jewelry or make up.  Do not wear lotions, powders, or perfumes.Do not wear deodorant.    Do not bring valuables to the hospital.  Contacts, dentures or bridgework may not be worn into surgery.  Leave suitcase in the car. After surgery it may be brought to your room.  For patients admitted to the hospital, checkout time is 11:00 AM the day of discharge.     Special Instructions: [employer] Wash: 1/2 bottle night before surgery and 1/2 bottle morning of surgery.neck down avoid private area, no shaving for 2 days before showers   Please read over the following fact sheets that you were given: MRSA Information  [facility] pre op nurse phone number [phone], call if needed

## 2011-03-28 NOTE — Progress Notes (Signed)
03/28/11 1547  OBSTRUCTIVE SLEEP APNEA  Have you ever been diagnosed with sleep apnea through a sleep study? No  Do you snore loudly (loud enough to be heard through closed doors)?  0  Do you often feel tired, fatigued, or sleepy during the daytime? 1  Has anyone observed you stop breathing during your sleep? 1  Do you have, or are you being treated for high blood pressure? 1  BMI more than 35 kg/m2? 0  Age over 76 years old? 1  Obstructive Sleep Apnea Score 4   Score 4 or greater  Updated health history;Results sent to PCP

## 2011-03-30 NOTE — H&P (Signed)
Meghan, Sexton              ACCOUNT NO.:  [id number]  MEDICAL RECORD NO.:  [id number]  LOCATION:  PADM                         FACILITY:  [facility]  PHYSICIAN:  Madlyn Frankel. Charlann Boxer, M.D.  DATE OF BIRTH:  11/03/1923  DATE OF ADMISSION:  03/28/2011 DATE OF DISCHARGE:  03/28/2011                             HISTORY & PHYSICAL   ADMITTING DIAGNOSIS:  Left knee osteoarthritis.  DATE OF SURGERY:  April 04, 2011  HISTORY OF PRESENT ILLNESS:  This is an 78 year old lady with a long history of degenerative arthritis of her left knee that is now failing conservative treatment.  She has pain both day and night, and difficulty ambulating secondary to this.  After discussion of treatments, benefits, risks, and options, the patient is now scheduled for total knee arthroplasty of the left knee.  Note that her medical doctor is Dr. Gwen Pounds, MD.  She is a candidate for tranexamic acid and will receive that preop.  She is also a candidate for dexamethasone and will receive that as well.  She plans on going to a nursing facility after surgery and wants to go to [facility] for her rehab.  She is not given her home medications today.  The surgery risks, benefits, and aftercare were discussed in detail with the patient.  Questions invited and answered.  PAST MEDICAL HISTORY:  Drug allergies to PENICILLIN with erythema nodosa, and SULFA with nausea and vomiting.  CURRENT MEDICATIONS:  Include: 1. Benazepril hydrochlorothiazide 20/12.5 one daily. 2. Synthroid 125 mcg 1 daily. 3. Trazodone 50 mg 1 q.h.s. 4. Pulmicort Flexhaler 180 mcg 2 puffs daily. 5. Atrovent nasal spray 0.6% 1 q.a.m. 6. Diazepam 2 mg 1 p.r.n. spasm. 7. Furosemide 20 mg 1 or 2 tablets p.r.n. swelling. 8. Meloxicam 15 mg 1 daily. 9. Methocarbamol 500 mg 1 b.i.d. p.r.n. spasm. 10.Aspirin 81 mg 1 daily. 11.MiraLax daily. 12.Neurontin 100 mg 1 b.i.d. 13.Fentanyl 50 mcg per hour patch, 1 patch every 3 days. 14.HyoMax SL 0.125  mg 1 or 2 under the tongue p.r.n. 15.Quinine sulfate 325 mg 1 capsule at bedtime as needed for cramps. 16.Protonix 40 mg 1 b.i.d. 17.Restasis 0.05% ophthalmic drops, 1 drop in each eye b.i.d. 18.Hydrocodone 5/500, 3 tablets q.6 h. p.r.n. pain. 19.Multivitamins.  MEDICAL ILLNESSES:  Include arthritis, hypothyroidism, hypertension, hiatal hernia, osteoporosis, allergies, and chronic pain secondary to multiple back surgeries.  PREVIOUS SURGERIES:  Include 3 scoliosis operations, tonsillectomy, suspension of uterus and appendectomy, tubal ligation, inguinal hernia repair, cholecystectomy, ovarian cystectomy, rotator cuff repair, bilateral cataracts, hiatal hernia repair, and lump removed from right breast which was benign.  FAMILY HISTORY:  Positive for pneumonia, arthritis, high blood pressure, myocardial infarction, breast cancer, and kidney cancer.  SOCIAL HISTORY:  The patient is widowed.  She is retired.  She does not smoke.  Does not drink, and again plans on going to [facility] postoperatively for rehabilitation.  REVIEW OF SYSTEMS:  CENTRAL NERVOUS SYSTEM:  Positive for mild hearing loss and balance problems, and tinnitus.  PULMONARY:  Positive for exertional shortness of breath, occasional wheezing secondary to her asthma and allergies.  CARDIOVASCULAR:  Negative for chest pain or palpitation.  GI:  Positive for constipation,  heartburn, and occasional difficulty swallowing.  GU:  Negative for urinary tract difficulty. MUSCULOSKELETAL:  Positive in HPI.  PHYSICAL EXAMINATION:  VITAL SIGNS:  BP 107/55, pulse 76 and regular, respirations 14. HEENT:  Head normocephalic.  Nose patent.  Ears patent.  Pupils are equal, round, and reactive to light.  Throat without injection. NECK:  Supple without adenopathy.  Carotids 2+ without bruit. CHEST:  Clear to auscultation.  No rales or rhonchi.  Respirations 16. HEART:  Regular rate and rhythm at 76 beats per minute with a  1/6 systolic ejection murmur. ABDOMEN:  Soft with active bowel sounds.  No masses or organomegaly. NEUROLOGIC:  The patient alert and oriented to time, place, and person. Cranial nerves 2 through 12 grossly intact. EXTREMITIES:  Shows the left knee with a valgus deformity, 5-degree flexion traction and further flexion to 110 degrees with pain.  She also has prominent thoracolumbar spine hardware from her previous surgeries for her scoliosis.  She does have intermittent tingling in her lower extremity secondary to her multiple back surgeries, and is on neurontin for that.  Her dorsalis pedis, posterior tibialis pulses are 1+.  ASSESSMENT:  End-stage osteoarthritis, left knee.  PLAN:  Total knee arthroplasty, left knee.     Meghan Sexton. Meghan Sexton, P.A.   ______________________________ Madlyn Frankel Charlann Boxer, M.D.    SJC/MEDQ  D:  03/30/2011  T:  03/30/2011  Job:  [id number]

## 2011-03-30 NOTE — Progress Notes (Signed)
H&P performed 03/30/2011 Dictation # [id number]

## 2011-04-04 ENCOUNTER — Encounter (HOSPITAL_COMMUNITY)
Admission: RE | Disposition: A | Discharge status: Skilled Nursing Facility | Payer: Self-pay | Source: Ambulatory Visit | Attending: Orthopedic Surgery

## 2011-04-04 ENCOUNTER — Encounter (HOSPITAL_COMMUNITY): Payer: Self-pay | Admitting: *Deleted

## 2011-04-04 ENCOUNTER — Inpatient Hospital Stay (HOSPITAL_COMMUNITY)
Admission: RE | Admit: 2011-04-04 | Discharge: 2011-04-07 | DRG: 470 | Disposition: A | Discharge status: Skilled Nursing Facility | Payer: Medicare Other | Source: Ambulatory Visit | Attending: Orthopedic Surgery | Admitting: Orthopedic Surgery

## 2011-04-04 ENCOUNTER — Encounter (HOSPITAL_COMMUNITY): Payer: Self-pay | Admitting: Anesthesiology

## 2011-04-04 ENCOUNTER — Ambulatory Visit (HOSPITAL_COMMUNITY): Payer: Medicare Other | Admitting: Anesthesiology

## 2011-04-04 DIAGNOSIS — I10 Essential (primary) hypertension: Secondary | ICD-10-CM | POA: Diagnosis present

## 2011-04-04 DIAGNOSIS — K219 Gastro-esophageal reflux disease without esophagitis: Secondary | ICD-10-CM | POA: Diagnosis present

## 2011-04-04 DIAGNOSIS — J449 Chronic obstructive pulmonary disease, unspecified: Secondary | ICD-10-CM | POA: Diagnosis present

## 2011-04-04 DIAGNOSIS — M810 Age-related osteoporosis without current pathological fracture: Secondary | ICD-10-CM | POA: Diagnosis present

## 2011-04-04 DIAGNOSIS — Z96659 Presence of unspecified artificial knee joint: Secondary | ICD-10-CM

## 2011-04-04 DIAGNOSIS — M1710 Unilateral primary osteoarthritis, unspecified knee: Principal | ICD-10-CM | POA: Diagnosis present

## 2011-04-04 DIAGNOSIS — E039 Hypothyroidism, unspecified: Secondary | ICD-10-CM | POA: Diagnosis present

## 2011-04-04 DIAGNOSIS — K449 Diaphragmatic hernia without obstruction or gangrene: Secondary | ICD-10-CM | POA: Diagnosis present

## 2011-04-04 DIAGNOSIS — G8929 Other chronic pain: Secondary | ICD-10-CM | POA: Diagnosis present

## 2011-04-04 DIAGNOSIS — J4489 Other specified chronic obstructive pulmonary disease: Secondary | ICD-10-CM | POA: Diagnosis present

## 2011-04-04 DIAGNOSIS — M549 Dorsalgia, unspecified: Secondary | ICD-10-CM | POA: Diagnosis present

## 2011-04-04 HISTORY — PX: TOTAL KNEE ARTHROPLASTY: SHX125

## 2011-04-04 LAB — TYPE AND SCREEN: Antibody Screen: NEGATIVE

## 2011-04-04 SURGERY — ARTHROPLASTY, KNEE, TOTAL
Anesthesia: General | Site: Knee | Laterality: Left | Wound class: Clean

## 2011-04-04 MED ORDER — DOCUSATE SODIUM 100 MG PO CAPS
100.0000 mg | ORAL_CAPSULE | Freq: Two times a day (BID) | ORAL | Status: DC
  Administered 2011-04-05 – 2011-04-07 (×5): 100 mg via ORAL
  Filled 2011-04-04 (×5): qty 1

## 2011-04-04 MED ORDER — QUININE SULFATE 324 MG PO CAPS
324.0000 mg | ORAL_CAPSULE | Freq: Every day | ORAL | Status: DC
  Administered 2011-04-04 – 2011-04-06 (×3): 324 mg via ORAL
  Filled 2011-04-04 (×4): qty 1

## 2011-04-04 MED ORDER — ONDANSETRON HCL 4 MG PO TABS: 4.0000 mg | ORAL_TABLET | Freq: Four times a day (QID) | ORAL | Status: DC | PRN

## 2011-04-04 MED ORDER — FUROSEMIDE 20 MG PO TABS: 20.0000 mg | ORAL_TABLET | Freq: Every day | ORAL | Status: DC

## 2011-04-04 MED ORDER — HYDROMORPHONE HCL PF 1 MG/ML IJ SOLN
0.5000 mg | INTRAMUSCULAR | Status: DC | PRN
  Administered 2011-04-05 (×2): 1 mg via INTRAVENOUS
  Filled 2011-04-04 (×2): qty 1

## 2011-04-04 MED ORDER — PANTOPRAZOLE SODIUM 40 MG PO TBEC
40.0000 mg | DELAYED_RELEASE_TABLET | Freq: Every day | ORAL | Status: DC
  Administered 2011-04-05 – 2011-04-07 (×3): 40 mg via ORAL
  Filled 2011-04-04 (×2): qty 1

## 2011-04-04 MED ORDER — PROPOFOL 10 MG/ML IV EMUL
INTRAVENOUS | Status: DC | PRN
  Administered 2011-04-04: 130 mg via INTRAVENOUS

## 2011-04-04 MED ORDER — METOCLOPRAMIDE HCL 5 MG/ML IJ SOLN: 5.0000 mg | Freq: Three times a day (TID) | INTRAMUSCULAR | Status: DC | PRN

## 2011-04-04 MED ORDER — ALUM & MAG HYDROXIDE-SIMETH 200-200-20 MG/5ML PO SUSP: 30.0000 mL | ORAL | Status: DC | PRN

## 2011-04-04 MED ORDER — FERROUS SULFATE 325 (65 FE) MG PO TABS
325.0000 mg | ORAL_TABLET | Freq: Three times a day (TID) | ORAL | Status: DC
  Administered 2011-04-05 – 2011-04-07 (×6): 325 mg via ORAL
  Filled 2011-04-04 (×8): qty 1

## 2011-04-04 MED ORDER — MENTHOL 3 MG MT LOZG
1.0000 | LOZENGE | OROMUCOSAL | Status: DC | PRN
  Filled 2011-04-04: qty 9

## 2011-04-04 MED ORDER — HYDROCHLOROTHIAZIDE 12.5 MG PO CAPS
12.5000 mg | ORAL_CAPSULE | Freq: Every day | ORAL | Status: DC
  Administered 2011-04-05 – 2011-04-07 (×3): 12.5 mg via ORAL
  Filled 2011-04-04 (×4): qty 1

## 2011-04-04 MED ORDER — HYDROCODONE-ACETAMINOPHEN 7.5-325 MG PO TABS
1.0000 | ORAL_TABLET | ORAL | Status: DC
  Administered 2011-04-04 – 2011-04-06 (×11): 2 via ORAL
  Administered 2011-04-06 – 2011-04-07 (×2): 1 via ORAL
  Administered 2011-04-07: 2 via ORAL
  Administered 2011-04-07 (×2): 1 via ORAL
  Filled 2011-04-04 (×16): qty 2

## 2011-04-04 MED ORDER — BUPIVACAINE-EPINEPHRINE 0.25% -1:200000 IJ SOLN
INTRAMUSCULAR | Status: AC
  Filled 2011-04-04: qty 1

## 2011-04-04 MED ORDER — BUPIVACAINE-EPINEPHRINE 0.25% -1:200000 IJ SOLN
INTRAMUSCULAR | Status: DC | PRN
  Administered 2011-04-04: 30 mL

## 2011-04-04 MED ORDER — EPHEDRINE SULFATE 50 MG/ML IJ SOLN
INTRAMUSCULAR | Status: DC | PRN
  Administered 2011-04-04: 5 mg via INTRAVENOUS

## 2011-04-04 MED ORDER — HYDROMORPHONE HCL PF 1 MG/ML IJ SOLN
INTRAMUSCULAR | Status: AC
  Filled 2011-04-04: qty 1

## 2011-04-04 MED ORDER — CLINDAMYCIN PHOSPHATE 600 MG/50ML IV SOLN
600.0000 mg | Freq: Four times a day (QID) | INTRAVENOUS | Status: AC
  Administered 2011-04-04 – 2011-04-05 (×3): 600 mg via INTRAVENOUS
  Filled 2011-04-04 (×3): qty 50

## 2011-04-04 MED ORDER — CHLORHEXIDINE GLUCONATE 4 % EX LIQD: 60.0000 mL | Freq: Once | CUTANEOUS | Status: DC

## 2011-04-04 MED ORDER — IPRATROPIUM BROMIDE 0.06 % NA SOLN
2.0000 | Freq: Every day | NASAL | Status: DC
  Filled 2011-04-04: qty 15

## 2011-04-04 MED ORDER — HYDROCHLOROTHIAZIDE 25 MG PO TABS: 12.5000 mg | ORAL_TABLET | Freq: Every day | ORAL | Status: DC

## 2011-04-04 MED ORDER — ACETAMINOPHEN 650 MG RE SUPP: 650.0000 mg | Freq: Four times a day (QID) | RECTAL | Status: DC | PRN

## 2011-04-04 MED ORDER — HYDROMORPHONE HCL PF 1 MG/ML IJ SOLN
0.2500 mg | INTRAMUSCULAR | Status: DC | PRN
  Administered 2011-04-04 (×4): 0.5 mg via INTRAVENOUS

## 2011-04-04 MED ORDER — ROPIVACAINE HCL 5 MG/ML IJ SOLN
INTRAMUSCULAR | Status: AC
  Filled 2011-04-04: qty 30

## 2011-04-04 MED ORDER — DIPHENHYDRAMINE HCL 25 MG PO CAPS: 25.0000 mg | ORAL_CAPSULE | Freq: Four times a day (QID) | ORAL | Status: DC | PRN

## 2011-04-04 MED ORDER — FENTANYL CITRATE 0.05 MG/ML IJ SOLN
INTRAMUSCULAR | Status: AC
  Filled 2011-04-04: qty 2

## 2011-04-04 MED ORDER — FLEET ENEMA 7-19 GM/118ML RE ENEM: 1.0000 | ENEMA | Freq: Once | RECTAL | Status: AC | PRN

## 2011-04-04 MED ORDER — ACETAMINOPHEN 10 MG/ML IV SOLN
INTRAVENOUS | Status: AC
  Filled 2011-04-04: qty 100

## 2011-04-04 MED ORDER — MIDAZOLAM HCL 2 MG/2ML IJ SOLN
1.0000 mg | INTRAMUSCULAR | Status: DC | PRN
  Administered 2011-04-04: 1 mg via INTRAVENOUS

## 2011-04-04 MED ORDER — POLYETHYLENE GLYCOL 3350 17 G PO PACK
17.0000 g | PACK | Freq: Two times a day (BID) | ORAL | Status: DC
  Administered 2011-04-05 – 2011-04-07 (×3): 17 g via ORAL
  Filled 2011-04-04 (×3): qty 1

## 2011-04-04 MED ORDER — ONDANSETRON HCL 4 MG/2ML IJ SOLN
INTRAMUSCULAR | Status: DC | PRN
  Administered 2011-04-04: 4 mg via INTRAVENOUS

## 2011-04-04 MED ORDER — BENAZEPRIL HCL 20 MG PO TABS
20.0000 mg | ORAL_TABLET | Freq: Every day | ORAL | Status: DC
  Administered 2011-04-05 – 2011-04-07 (×3): 20 mg via ORAL
  Filled 2011-04-04 (×3): qty 1

## 2011-04-04 MED ORDER — CLINDAMYCIN PHOSPHATE 600 MG/50ML IV SOLN
600.0000 mg | INTRAVENOUS | Status: AC
  Administered 2011-04-04: 600 mg via INTRAVENOUS

## 2011-04-04 MED ORDER — DEXAMETHASONE SODIUM PHOSPHATE 10 MG/ML IJ SOLN: 10.0000 mg | Freq: Once | INTRAMUSCULAR | Status: DC

## 2011-04-04 MED ORDER — PHENOL 1.4 % MT LIQD
1.0000 | OROMUCOSAL | Status: DC | PRN
  Filled 2011-04-04: qty 177

## 2011-04-04 MED ORDER — BUPIVACAINE-EPINEPHRINE PF 0.25-1:200000 % IJ SOLN
INTRAMUSCULAR | Status: AC
  Filled 2011-04-04: qty 30

## 2011-04-04 MED ORDER — DIPHENHYDRAMINE HCL 50 MG/ML IJ SOLN
12.5000 mg | Freq: Once | INTRAMUSCULAR | Status: AC
  Administered 2011-04-04: 12.5 mg via INTRAVENOUS

## 2011-04-04 MED ORDER — ROPIVACAINE HCL 5 MG/ML IJ SOLN
INTRAMUSCULAR | Status: DC | PRN
  Administered 2011-04-04: 30 mL

## 2011-04-04 MED ORDER — ACETAMINOPHEN 325 MG PO TABS: 650.0000 mg | ORAL_TABLET | Freq: Four times a day (QID) | ORAL | Status: DC | PRN

## 2011-04-04 MED ORDER — HYDROMORPHONE HCL PF 1 MG/ML IJ SOLN
0.5000 mg | INTRAMUSCULAR | Status: AC | PRN
  Administered 2011-04-04 (×4): 0.5 mg via INTRAVENOUS

## 2011-04-04 MED ORDER — DIAZEPAM 2 MG PO TABS
2.0000 mg | ORAL_TABLET | Freq: Four times a day (QID) | ORAL | Status: DC | PRN
Start: 2011-04-04 — End: 2011-04-07

## 2011-04-04 MED ORDER — KETOROLAC TROMETHAMINE 30 MG/ML IJ SOLN
INTRAMUSCULAR | Status: AC
  Filled 2011-04-04: qty 1

## 2011-04-04 MED ORDER — SODIUM CHLORIDE 0.9 % IV SOLN
INTRAVENOUS | Status: DC
  Administered 2011-04-04: 18:00:00 via INTRAVENOUS
  Filled 2011-04-04 (×10): qty 1000

## 2011-04-04 MED ORDER — DEXAMETHASONE SODIUM PHOSPHATE 10 MG/ML IJ SOLN
10.0000 mg | Freq: Once | INTRAMUSCULAR | Status: AC
  Administered 2011-04-05: 10 mg via INTRAVENOUS
  Filled 2011-04-04: qty 1

## 2011-04-04 MED ORDER — GABAPENTIN 100 MG PO CAPS
100.0000 mg | ORAL_CAPSULE | Freq: Two times a day (BID) | ORAL | Status: DC
  Administered 2011-04-05 – 2011-04-07 (×5): 100 mg via ORAL
  Filled 2011-04-04 (×7): qty 1

## 2011-04-04 MED ORDER — FENTANYL CITRATE 0.05 MG/ML IJ SOLN
25.0000 ug | INTRAMUSCULAR | Status: DC | PRN
  Administered 2011-04-04 (×4): 25 ug via INTRAVENOUS

## 2011-04-04 MED ORDER — ACETAMINOPHEN 10 MG/ML IV SOLN
INTRAVENOUS | Status: DC | PRN
  Administered 2011-04-04: 1000 mg via INTRAVENOUS

## 2011-04-04 MED ORDER — METOCLOPRAMIDE HCL 10 MG PO TABS: 5.0000 mg | ORAL_TABLET | Freq: Three times a day (TID) | ORAL | Status: DC | PRN

## 2011-04-04 MED ORDER — HYOSCYAMINE SULFATE 0.125 MG PO TBDP
0.1250 mg | ORAL_TABLET | ORAL | Status: DC | PRN
  Filled 2011-04-04: qty 1

## 2011-04-04 MED ORDER — FENTANYL CITRATE 0.05 MG/ML IJ SOLN
INTRAMUSCULAR | Status: DC | PRN
  Administered 2011-04-04 (×5): 50 ug via INTRAVENOUS

## 2011-04-04 MED ORDER — 0.9 % SODIUM CHLORIDE (POUR BTL) OPTIME
TOPICAL | Status: DC | PRN
  Administered 2011-04-04: 1000 mL

## 2011-04-04 MED ORDER — DIPHENHYDRAMINE HCL 50 MG/ML IJ SOLN
INTRAMUSCULAR | Status: AC
  Filled 2011-04-04: qty 1

## 2011-04-04 MED ORDER — TRANEXAMIC ACID 100 MG/ML IV SOLN
1010.0000 mg | Freq: Once | INTRAVENOUS | Status: AC
  Administered 2011-04-04: 1010 mg via INTRAVENOUS
  Filled 2011-04-04: qty 10.1

## 2011-04-04 MED ORDER — LACTATED RINGERS IV SOLN
INTRAVENOUS | Status: DC
  Administered 2011-04-04: 11:00:00 via INTRAVENOUS
  Administered 2011-04-04: 1000 mL via INTRAVENOUS

## 2011-04-04 MED ORDER — METHOCARBAMOL 100 MG/ML IJ SOLN
500.0000 mg | Freq: Four times a day (QID) | INTRAVENOUS | Status: DC | PRN
  Administered 2011-04-05: 500 mg via INTRAVENOUS
  Filled 2011-04-04 (×2): qty 5

## 2011-04-04 MED ORDER — METHOCARBAMOL 500 MG PO TABS
500.0000 mg | ORAL_TABLET | Freq: Four times a day (QID) | ORAL | Status: DC | PRN
  Administered 2011-04-05: 500 mg via ORAL
  Filled 2011-04-04: qty 1

## 2011-04-04 MED ORDER — MIDAZOLAM HCL 2 MG/2ML IJ SOLN
INTRAMUSCULAR | Status: AC
  Filled 2011-04-04: qty 2

## 2011-04-04 MED ORDER — CLINDAMYCIN PHOSPHATE 600 MG/50ML IV SOLN
INTRAVENOUS | Status: AC
  Filled 2011-04-04: qty 50

## 2011-04-04 MED ORDER — ROCURONIUM BROMIDE 100 MG/10ML IV SOLN
INTRAVENOUS | Status: DC | PRN
  Administered 2011-04-04: 40 mg via INTRAVENOUS

## 2011-04-04 MED ORDER — LEVOTHYROXINE SODIUM 125 MCG PO TABS
125.0000 ug | ORAL_TABLET | Freq: Every day | ORAL | Status: DC
  Administered 2011-04-05 – 2011-04-07 (×3): 125 ug via ORAL
  Filled 2011-04-04 (×3): qty 1

## 2011-04-04 MED ORDER — HYDROMORPHONE HCL PF 1 MG/ML IJ SOLN
INTRAMUSCULAR | Status: DC | PRN
  Administered 2011-04-04 (×4): 0.5 mg via INTRAVENOUS

## 2011-04-04 MED ORDER — RIVAROXABAN 10 MG PO TABS
10.0000 mg | ORAL_TABLET | ORAL | Status: DC
  Administered 2011-04-05 – 2011-04-07 (×3): 10 mg via ORAL
  Filled 2011-04-04 (×3): qty 1

## 2011-04-04 MED ORDER — DEXAMETHASONE SODIUM PHOSPHATE 10 MG/ML IJ SOLN
INTRAMUSCULAR | Status: DC | PRN
  Administered 2011-04-04: 10 mg via INTRAVENOUS

## 2011-04-04 MED ORDER — LIDOCAINE HCL (CARDIAC) 20 MG/ML IV SOLN
INTRAVENOUS | Status: DC | PRN
  Administered 2011-04-04: 80 mg via INTRAVENOUS

## 2011-04-04 MED ORDER — TRAZODONE HCL 50 MG PO TABS
50.0000 mg | ORAL_TABLET | Freq: Every day | ORAL | Status: DC
  Administered 2011-04-04 – 2011-04-06 (×3): 50 mg via ORAL
  Filled 2011-04-04 (×4): qty 1

## 2011-04-04 MED ORDER — BISACODYL 5 MG PO TBEC: 5.0000 mg | DELAYED_RELEASE_TABLET | Freq: Every day | ORAL | Status: DC | PRN

## 2011-04-04 MED ORDER — NEOSTIGMINE METHYLSULFATE 1 MG/ML IJ SOLN
INTRAMUSCULAR | Status: DC | PRN
  Administered 2011-04-04: 3.5 mg via INTRAVENOUS

## 2011-04-04 MED ORDER — GLYCOPYRROLATE 0.2 MG/ML IJ SOLN
INTRAMUSCULAR | Status: DC | PRN
  Administered 2011-04-04: .5 mg via INTRAVENOUS

## 2011-04-04 MED ORDER — IPRATROPIUM BROMIDE 0.03 % NA SOLN
2.0000 | Freq: Two times a day (BID) | NASAL | Status: DC
  Administered 2011-04-06 – 2011-04-07 (×3): 2 via NASAL
  Filled 2011-04-04: qty 30

## 2011-04-04 MED ORDER — FLUTICASONE PROPIONATE HFA 44 MCG/ACT IN AERO
2.0000 | INHALATION_SPRAY | Freq: Every day | RESPIRATORY_TRACT | Status: DC
  Administered 2011-04-05 – 2011-04-07 (×3): 2 via RESPIRATORY_TRACT
  Filled 2011-04-04: qty 10.6

## 2011-04-04 MED ORDER — ONDANSETRON HCL 4 MG/2ML IJ SOLN: 4.0000 mg | Freq: Four times a day (QID) | INTRAMUSCULAR | Status: DC | PRN

## 2011-04-04 MED ORDER — FUROSEMIDE 20 MG PO TABS
20.0000 mg | ORAL_TABLET | Freq: Every day | ORAL | Status: DC
  Administered 2011-04-05 – 2011-04-07 (×3): 20 mg via ORAL
  Filled 2011-04-04 (×4): qty 1

## 2011-04-04 SURGICAL SUPPLY — 56 items
ADH SKN CLS APL DERMABOND .7 (GAUZE/BANDAGES/DRESSINGS) ×1
BAG SPEC THK2 15X12 ZIP CLS (MISCELLANEOUS) ×1
BAG ZIPLOCK 12X15 (MISCELLANEOUS) ×2 IMPLANT
BANDAGE ELASTIC 6 VELCRO ST LF (GAUZE/BANDAGES/DRESSINGS) ×2 IMPLANT
BANDAGE ESMARK 6X9 LF (GAUZE/BANDAGES/DRESSINGS) ×1 IMPLANT
BLADE SAW SGTL 13.0X1.19X90.0M (BLADE) ×2 IMPLANT
BNDG CMPR 9X6 STRL LF SNTH (GAUZE/BANDAGES/DRESSINGS) ×1
BNDG ESMARK 6X9 LF (GAUZE/BANDAGES/DRESSINGS) ×2
BOWL SMART MIX CTS (DISPOSABLE) ×2 IMPLANT
CEMENT HV SMART SET (Cement) ×2 IMPLANT
CLOTH BEACON ORANGE TIMEOUT ST (SAFETY) ×2 IMPLANT
CUFF TOURN SGL QUICK 34 (TOURNIQUET CUFF) ×2
CUFF TRNQT CYL 34X4X40X1 (TOURNIQUET CUFF) ×1 IMPLANT
DERMABOND ADVANCED (GAUZE/BANDAGES/DRESSINGS) ×1
DERMABOND ADVANCED .7 DNX12 (GAUZE/BANDAGES/DRESSINGS) ×1 IMPLANT
DRAPE EXTREMITY T 121X128X90 (DRAPE) ×2 IMPLANT
DRAPE POUCH INSTRU U-SHP 10X18 (DRAPES) ×2 IMPLANT
DRAPE U-SHAPE 47X51 STRL (DRAPES) ×2 IMPLANT
DRSG AQUACEL AG ADV 3.5X10 (GAUZE/BANDAGES/DRESSINGS) ×2 IMPLANT
DRSG TEGADERM 4X4.75 (GAUZE/BANDAGES/DRESSINGS) ×2 IMPLANT
DURAPREP 26ML APPLICATOR (WOUND CARE) ×2 IMPLANT
ELECT REM PT RETURN 9FT ADLT (ELECTROSURGICAL) ×2
ELECTRODE REM PT RTRN 9FT ADLT (ELECTROSURGICAL) ×1 IMPLANT
EVACUATOR 1/8 PVC DRAIN (DRAIN) ×2 IMPLANT
FACESHIELD LNG OPTICON STERILE (SAFETY) ×10 IMPLANT
GAUZE SPONGE 2X2 8PLY STRL LF (GAUZE/BANDAGES/DRESSINGS) ×1 IMPLANT
GLOVE BIOGEL PI IND STRL 7.5 (GLOVE) ×1 IMPLANT
GLOVE BIOGEL PI IND STRL 8 (GLOVE) ×1 IMPLANT
GLOVE BIOGEL PI INDICATOR 7.5 (GLOVE) ×1
GLOVE BIOGEL PI INDICATOR 8 (GLOVE) ×1
GLOVE ECLIPSE 8.0 STRL XLNG CF (GLOVE) ×2 IMPLANT
GLOVE ORTHO TXT STRL SZ7.5 (GLOVE) ×4 IMPLANT
GOWN BRE IMP PREV XXLGXLNG (GOWN DISPOSABLE) ×2 IMPLANT
GOWN STRL NON-REIN LRG LVL3 (GOWN DISPOSABLE) ×2 IMPLANT
HANDPIECE INTERPULSE COAX TIP (DISPOSABLE) ×2
IMMOBILIZER KNEE 16 UNIV (MISCELLANEOUS) ×1 IMPLANT
KIT BASIN OR (CUSTOM PROCEDURE TRAY) ×2 IMPLANT
MANIFOLD NEPTUNE II (INSTRUMENTS) ×2 IMPLANT
NDL SAFETY ECLIPSE 18X1.5 (NEEDLE) ×1 IMPLANT
NEEDLE HYPO 18GX1.5 SHARP (NEEDLE) ×2
NS IRRIG 1000ML POUR BTL (IV SOLUTION) ×3 IMPLANT
PACK TOTAL JOINT (CUSTOM PROCEDURE TRAY) ×2 IMPLANT
POSITIONER SURGICAL ARM (MISCELLANEOUS) ×2 IMPLANT
SET HNDPC FAN SPRY TIP SCT (DISPOSABLE) ×1 IMPLANT
SET PAD KNEE POSITIONER (MISCELLANEOUS) ×2 IMPLANT
SPONGE GAUZE 2X2 STER 10/PKG (GAUZE/BANDAGES/DRESSINGS) ×1
SUCTION FRAZIER 12FR DISP (SUCTIONS) ×2 IMPLANT
SUT MNCRL AB 4-0 PS2 18 (SUTURE) ×2 IMPLANT
SUT VIC AB 1 CT1 36 (SUTURE) ×6 IMPLANT
SUT VIC AB 2-0 CT1 27 (SUTURE) ×6
SUT VIC AB 2-0 CT1 TAPERPNT 27 (SUTURE) ×3 IMPLANT
SYR 50ML LL SCALE MARK (SYRINGE) ×2 IMPLANT
TOWEL OR 17X26 10 PK STRL BLUE (TOWEL DISPOSABLE) ×4 IMPLANT
TRAY FOLEY CATH 14FRSI W/METER (CATHETERS) ×2 IMPLANT
WATER STERILE IRR 1500ML POUR (IV SOLUTION) ×2 IMPLANT
WRAP KNEE MAXI GEL POST OP (GAUZE/BANDAGES/DRESSINGS) ×2 IMPLANT

## 2011-04-04 NOTE — Anesthesia Preprocedure Evaluation (Addendum)
Anesthesia Evaluation  Patient identified by MRN, date of birth, ID band Patient awake    Reviewed: Allergy & Precautions, H&P , NPO status , Patient's Chart, lab work & pertinent test results  Airway Mallampati: II TM Distance: >3 FB Neck ROM: Full    Dental No notable dental hx.    Pulmonary asthma , sleep apnea , COPD COPD inhaler,  breath sounds clear to auscultation  Pulmonary exam normal       Cardiovascular hypertension, Pt. on medications Rhythm:Regular Rate:Normal     Neuro/Psych  Neuromuscular disease negative psych ROS   GI/Hepatic Neg liver ROS, GERD-  Medicated,  Endo/Other  Hypothyroidism   Renal/GU negative Renal ROS  negative genitourinary   Musculoskeletal negative musculoskeletal ROS (+)   Abdominal (+) + obese,   Peds negative pediatric ROS (+)  Hematology negative hematology ROS (+)   Anesthesia Other Findings   Reproductive/Obstetrics negative OB ROS                           Anesthesia Physical Anesthesia Plan  ASA: III  Anesthesia Plan: General   Post-op Pain Management:    Induction:   Airway Management Planned: Oral ETT  Additional Equipment:   Intra-op Plan:   Post-operative Plan:   Informed Consent:   Plan Discussed with:   Anesthesia Plan Comments: (Discussed risks/benefits of general versus spinal. Has had numerous back surgeries. Prefers general with femoral nerve block. .Discussed risks of femoral nerve block including failure, bleeding, infection, nerve damage.  Femoral nerve block does not usually prevent all pain.  Questions answered.  Patient consents to block. )        Anesthesia Quick Evaluation

## 2011-04-04 NOTE — Progress Notes (Signed)
Utilization review completed.  

## 2011-04-04 NOTE — Op Note (Signed)
NAME:  Meghan Sexton                      MEDICAL RECORD NO.:  [id number]                             FACILITY:  [facility]      PHYSICIAN:  Madlyn Frankel. Charlann Boxer, M.D.  DATE OF BIRTH:  [birth year]      DATE OF PROCEDURE:  04/04/2011                                     OPERATIVE REPORT         PREOPERATIVE DIAGNOSIS:  Left knee osteoarthritis.      POSTOPERATIVE DIAGNOSIS:  Left knee osteoarthritis.      FINDINGS:  The patient was noted to have complete loss of cartilage and   bone-on-bone arthritis with associated osteophytes in the lateral and patellofemoral compartments of   the knee with a significant synovitis and associated effusion.      PROCEDURE:  Left total knee replacement.      COMPONENTS USED:  DePuy rotating platform posterior stabilized knee   system, a size 2 femur, 2 tibia, 12.5 mm insert, and 41 patellar   button.      SURGEON:  Madlyn Frankel. Charlann Boxer, M.D.      ASSISTANT:  Lanney Gins, PA-C.      ANESTHESIA:  General and Regional.      SPECIMENS:  None.      COMPLICATION:  None.      DRAINS:  One Hemovac.  EBL: <100cc      TOURNIQUET TIME:   Total Tourniquet Time Documented: Thigh (Left) - 29 minutes .      The patient was stable to the recovery room.      INDICATION FOR PROCEDURE:  Meghan Sexton is a 78 y.o. female patient of   mine.  The patient had been seen, evaluated, and treated conservatively in the   office with medication, activity modification, and injections.  The patient had   radiographic changes of bone-on-bone arthritis with endplate sclerosis and osteophytes noted.      The patient failed conservative measures including medication, injections, and activity modification, and at this point was ready for more definitive measures.   Based on the radiographic changes and failed conservative measures, the patient   decided to proceed with total knee replacement.  Risks of infection,   DVT, component failure, need for revision surgery, postop  course, and   expectations were all   discussed and reviewed.  Consent was obtained for benefit of pain   relief.      PROCEDURE IN DETAIL:  The patient was brought to the operative theater.   Once adequate anesthesia, preoperative antibiotics, 2 gm of Ancef administered, the patient was positioned supine with the left thigh tourniquet placed.  The  left lower extremity was prepped and draped in sterile fashion.  A time-   out was performed identifying the patient, planned procedure, and   extremity.      The left lower extremity was placed in the [facility] leg holder.  The leg was   exsanguinated, tourniquet elevated to 250 mmHg.  A midline incision was   made followed by median parapatellar arthrotomy.  Following initial   exposure, attention was first directed to the patella.  Precut   measurement was noted to be 20 mm.  I resected down to 13 mm and used a   35 patellar button to restore patellar height as well as cover the cut   surface.      The lug holes were drilled and a metal shim was placed to protect the   patella from retractors and saw blades.      At this point, attention was now directed to the femur.  The femoral   canal was opened with a drill, irrigated to try to prevent fat emboli.  An   intramedullary rod was passed at 3 degrees valgus, 9 mm of bone was   resected off the distal femur.  Following this resection, the tibia was   subluxated anteriorly.  Using the extramedullary guide, 8 mm of bone was resected off   the proximal lateral tibia.  We confirmed the gap would be   stable medially and laterally with a 10 mm insert as well as confirmed   the cut was perpendicular in the coronal plane, checking with an alignment rod.      Once this was done, I sized the femur to be a size 2 in the anterior-   posterior dimension, chose a standard component based on medial and   lateral dimension.  The size 2 rotation block was then pinned in   position anterior referenced  using the C-clamp to set rotation.  The   anterior, posterior, and  chamfer cuts were made without difficulty nor   notching making certain that I was along the anterior cortex to help   with flexion gap stability.      The final box cut was made off the lateral aspect of distal femur.      At this point, the tibia was sized to be a size 2, the size 2 tray was   then pinned in position through the medial third of the tubercle,   drilled, and keel punched.  Trial reduction was now carried with a 2 femur,  2 tibia, a 12.5 mm insert, and the 35 patella botton.  The knee was brought to   extension, full extension with good flexion stability with the patella   tracking through the trochlea without application of pressure.  Given   all these findings, the trial components removed.  Final components were   opened and cement was mixed.  The knee was irrigated with normal saline   solution and pulse lavage.  The synovial lining was   then injected with 0.25% Marcaine with epinephrine,   total of 30cc.      The knee was irrigated.  Final implants were then cemented onto clean and   dried cut surfaces of bone with the knee brought to extension with a 12.5   mm trial insert.      Once the cement had fully cured, the excess cement was removed   throughout the knee.  I confirmed I was satisfied with the range of   motion and stability, and the final 12.5 mm insert was chosen.  It was   placed into the knee.      The tourniquet had been let down at 29 minutes.  No significant   hemostasis required.  The medium Hemovac drain was placed deep.  The   extensor mechanism was then reapproximated using #1 Vicryl with the knee   in flexion.  The   remaining wound was closed with 2-0 Vicryl and running 4-0  Monocryl.   The knee was cleaned, dried, dressed sterilely using Dermabond and   Aquacel dressing.  Drain site dressed separately.  The patient was then   brought to recovery room in stable condition,  tolerating the procedure   well.   Please note that Physician Assistant, Lanney Gins, was present for the entirety of the case, and was utilized for pre-operative positioning, peri-operative retractor management, general facilitation of the procedure.  He was also utilized for primary wound closure at the end of the case.              Madlyn Frankel Charlann Boxer, M.D.

## 2011-04-04 NOTE — Anesthesia Procedure Notes (Signed)
Anesthesia Regional Block:  Femoral nerve block  Pre-Anesthetic Checklist: ,, timeout performed, Correct Patient, Correct Site, Correct Laterality, Correct Procedure, Correct Position, site marked, Risks and benefits discussed,  Surgical consent,  Pre-op evaluation,  At surgeon's request and post-op pain management  Laterality: Left and Lower  Prep: chloraprep       Needles:   Needle Type: Stimiplex      Needle Gauge: 21 G    Additional Needles:  Procedures: ultrasound guided and nerve stimulator Femoral nerve block Narrative:   Performed by: Personally  Anesthesiologist: Alegandro Macnaughton  Additional Notes: No pain on injection. No increased resistance to injection. Motor intact immediately after block. Loss of quadriceps strength at 20 minutes.   Femoral nerve block

## 2011-04-04 NOTE — Anesthesia Postprocedure Evaluation (Signed)
  Anesthesia Post-op Note  Patient: Meghan Sexton  Procedure(s) Performed: Procedure(s) (LRB): TOTAL KNEE ARTHROPLASTY (Left)  Patient Location: PACU  Anesthesia Type: GA combined with regional for post-op pain  Level of Consciousness: awake and alert   Airway and Oxygen Therapy: Patient Spontanous Breathing  Post-op Pain: mild  Post-op Assessment: Post-op Vital signs reviewed, Patient's Cardiovascular Status Stable, Respiratory Function Stable, Patent Airway and No signs of Nausea or vomiting  Post-op Vital Signs: stable  Complications: No apparent anesthesia complications

## 2011-04-04 NOTE — Preoperative (Signed)
Beta Blockers   Reason not to administer Beta Blockers:Not Applicable 

## 2011-04-04 NOTE — Addendum Note (Signed)
Addendum  created 04/04/11 1500 by Azell Der, MD   Modules edited:Orders

## 2011-04-04 NOTE — H&P (View-Only) (Signed)
NAME:  Meghan Sexton, Meghan Sexton              ACCOUNT NO.:  [id number]  MEDICAL RECORD NO.:  [id number]  LOCATION:  [facility]                         FACILITY:  [facility]  PHYSICIAN:  Matthew D. Olin, M.D.  DATE OF BIRTH:  05/13/1923  DATE OF ADMISSION:  03/28/2011 DATE OF DISCHARGE:  03/28/2011                             HISTORY & PHYSICAL   ADMITTING DIAGNOSIS:  Left knee osteoarthritis.  DATE OF SURGERY:  April 04, 2011  HISTORY OF PRESENT ILLNESS:  This is an 78-year-old lady with a long history of degenerative arthritis of her left knee that is now failing conservative treatment.  She has pain both day and night, and difficulty ambulating secondary to this.  After discussion of treatments, benefits, risks, and options, the patient is now scheduled for total knee arthroplasty of the left knee.  Note that her medical doctor is Dr. John M Russo, MD.  She is a candidate for tranexamic acid and will receive that preop.  She is also a candidate for dexamethasone and will receive that as well.  She plans on going to a nursing facility after surgery and wants to go to [facility] for her rehab.  She is not given her home medications today.  The surgery risks, benefits, and aftercare were discussed in detail with the patient.  Questions invited and answered.  PAST MEDICAL HISTORY:  Drug allergies to PENICILLIN with erythema nodosa, and SULFA with nausea and vomiting.  CURRENT MEDICATIONS:  Include: 1. Benazepril hydrochlorothiazide 20/12.5 one daily. 2. Synthroid 125 mcg 1 daily. 3. Trazodone 50 mg 1 q.h.s. 4. Pulmicort Flexhaler 180 mcg 2 puffs daily. 5. Atrovent nasal spray 0.6% 1 q.a.m. 6. Diazepam 2 mg 1 p.r.n. spasm. 7. Furosemide 20 mg 1 or 2 tablets p.r.n. swelling. 8. Meloxicam 15 mg 1 daily. 9. Methocarbamol 500 mg 1 b.i.d. p.r.n. spasm. 10.Aspirin 81 mg 1 daily. 11.MiraLax daily. 12.Neurontin 100 mg 1 b.i.d. 13.Fentanyl 50 mcg per hour patch, 1 patch every 3 days. 14.HyoMax SL 0.125  mg 1 or 2 under the tongue p.r.n. 15.Quinine sulfate 325 mg 1 capsule at bedtime as needed for cramps. 16.Protonix 40 mg 1 b.i.d. 17.Restasis 0.05% ophthalmic drops, 1 drop in each eye b.i.d. 18.Hydrocodone 5/500, 3 tablets q.6 h. p.r.n. pain. 19.Multivitamins.  MEDICAL ILLNESSES:  Include arthritis, hypothyroidism, hypertension, hiatal hernia, osteoporosis, allergies, and chronic pain secondary to multiple back surgeries.  PREVIOUS SURGERIES:  Include 3 scoliosis operations, tonsillectomy, suspension of uterus and appendectomy, tubal ligation, inguinal hernia repair, cholecystectomy, ovarian cystectomy, rotator cuff repair, bilateral cataracts, hiatal hernia repair, and lump removed from right breast which was benign.  FAMILY HISTORY:  Positive for pneumonia, arthritis, high blood pressure, myocardial infarction, breast cancer, and kidney cancer.  SOCIAL HISTORY:  The patient is widowed.  She is retired.  She does not smoke.  Does not drink, and again plans on going to [facility] postoperatively for rehabilitation.  REVIEW OF SYSTEMS:  CENTRAL NERVOUS SYSTEM:  Positive for mild hearing loss and balance problems, and tinnitus.  PULMONARY:  Positive for exertional shortness of breath, occasional wheezing secondary to her asthma and allergies.  CARDIOVASCULAR:  Negative for chest pain or palpitation.  GI:  Positive for constipation,   heartburn, and occasional difficulty swallowing.  GU:  Negative for urinary tract difficulty. MUSCULOSKELETAL:  Positive in HPI.  PHYSICAL EXAMINATION:  VITAL SIGNS:  BP 107/55, pulse 76 and regular, respirations 14. HEENT:  Head normocephalic.  Nose patent.  Ears patent.  Pupils are equal, round, and reactive to light.  Throat without injection. NECK:  Supple without adenopathy.  Carotids 2+ without bruit. CHEST:  Clear to auscultation.  No rales or rhonchi.  Respirations 16. HEART:  Regular rate and rhythm at 76 beats per minute with a  1/6 systolic ejection murmur. ABDOMEN:  Soft with active bowel sounds.  No masses or organomegaly. NEUROLOGIC:  The patient alert and oriented to time, place, and person. Cranial nerves 2 through 12 grossly intact. EXTREMITIES:  Shows the left knee with a valgus deformity, 5-degree flexion traction and further flexion to 110 degrees with pain.  She also has prominent thoracolumbar spine hardware from her previous surgeries for her scoliosis.  She does have intermittent tingling in her lower extremity secondary to her multiple back surgeries, and is on neurontin for that.  Her dorsalis pedis, posterior tibialis pulses are 1+.  ASSESSMENT:  End-stage osteoarthritis, left knee.  PLAN:  Total knee arthroplasty, left knee.     Mikel Pyon J. Remer Couse, P.A.   ______________________________ Matthew D. Olin, M.D.    SJC/MEDQ  D:  03/30/2011  T:  03/30/2011  Job:  [id number] 

## 2011-04-04 NOTE — Plan of Care (Signed)
Problem: Consults Goal: Diagnosis- Total Joint Replacement Left total knee     

## 2011-04-04 NOTE — Transfer of Care (Signed)
Immediate Anesthesia Transfer of Care Note  Patient: Meghan Sexton  Procedure(s) Performed: Procedure(s) (LRB): TOTAL KNEE ARTHROPLASTY (Left)  Patient Location: PACU  Anesthesia Type: General  Level of Consciousness: awake, alert  and oriented  Airway & Oxygen Therapy: Patient Spontanous Breathing and Patient connected to face mask oxygen  Post-op Assessment: Report given to PACU RN, Post -op Vital signs reviewed and stable and Patient moving all extremities  Post vital signs: Reviewed and stable  Complications: No apparent anesthesia complications

## 2011-04-04 NOTE — Interval H&P Note (Signed)
History and Physical Interval Note:  04/04/2011 10:35 AM  Meghan Sexton  has presented today for surgery, with the diagnosis of left knee osteoarhtritis  The various methods of treatment have been discussed with the patient and family. After consideration of risks, benefits and other options for treatment, the patient has consented to  Procedure(s) (LRB): LEFT TOTAL KNEE ARTHROPLASTY (Left) as a surgical intervention .  The patients' history has been reviewed, patient examined, no change in status, stable for surgery.  I have reviewed the patients' chart and labs.  Questions were answered to the patient's satisfaction.     [name]

## 2011-04-05 LAB — CBC
Hemoglobin: 9.4 g/dL — ABNORMAL LOW (ref 12.0–15.0)
MCHC: 33.5 g/dL (ref 30.0–36.0)
Platelets: 204 10*3/uL (ref 150–400)
RDW: 12.9 % (ref 11.5–15.5)

## 2011-04-05 LAB — BASIC METABOLIC PANEL
GFR calc Af Amer: 72 mL/min — ABNORMAL LOW (ref >90)
GFR calc non Af Amer: 63 mL/min — ABNORMAL LOW (ref >90)
Potassium: 4.2 mEq/L (ref 3.5–5.1)
Sodium: 135 mEq/L (ref 135–145)

## 2011-04-05 MED ORDER — CYCLOSPORINE 0.05 % OP EMUL
1.0000 [drp] | Freq: Two times a day (BID) | OPHTHALMIC | Status: DC
  Administered 2011-04-05 – 2011-04-07 (×5): 1 [drp] via OPHTHALMIC
  Filled 2011-04-05 (×6): qty 1

## 2011-04-05 MED ORDER — BLISTEX EX OINT
TOPICAL_OINTMENT | CUTANEOUS | Status: AC
  Administered 2011-04-05: 1
  Filled 2011-04-05: qty 10

## 2011-04-05 NOTE — Progress Notes (Signed)
Patient ID: JANIS SOL, female   DOB: 1923-02-08, 78 y.o.   MRN: [id number] Subjective: 1 Day Post-Op Procedure(s) (LRB): TOTAL KNEE ARTHROPLASTY (Left)    Patient reports pain as mild to moderate, I think FNB may still be effective  Objective:   VITALS:   Filed Vitals:   04/05/11 0615  BP: 100/58  Pulse: 59  Temp: 97.8 F (36.6 C)  Resp: 14    Neurovascular intact Incision: dressing C/D/I, ACE wrap inplace, HV removed without difficulty  LABS  Basename 04/05/11 0405  HGB 9.4*  HCT 28.1*  WBC 11.3*  PLT 204     Basename 04/05/11 0405  NA 135  K 4.2  BUN 14  CREATININE 0.83  GLUCOSE 109*    No results found for this basename: LABPT:2,INR:2 in the last 72 hours   Assessment/Plan: 1 Day Post-Op Procedure(s) (LRB): TOTAL KNEE ARTHROPLASTY (Left)   Up with therapy Discharge to SNF  [facility]AT LLE SW consult for SNF Thursday Order restasis eye drops from home

## 2011-04-05 NOTE — Evaluation (Signed)
Occupational Therapy Evaluation Patient Details Name: Meghan Sexton MRN: [id number] DOB: 16-Jan-1923 Today's Date: 04/05/2011  Problem List:  Patient Active Problem List  Diagnoses  . HYPERTENSION  . HEMORRHOIDS, EXTERNAL  . COPD  . ESOPHAGEAL STRICTURE  . GERD  . HIATAL HERNIA  . DIVERTICULOSIS, COLON  . CONSTIPATION  . DYSPHAGIA UNSPECIFIED  . OTHER DYSPHAGIA  . EPIGASTRIC PAIN  . S/P Left TKA    Past Medical History:  Past Medical History  Diagnosis Date  . Hypertension   . Asthma   . Hypothyroidism   . Constipation   . Blood transfusion 2010     1 unit after each back surgery  . Sleep apnea     stopbang=4   Past Surgical History:  Past Surgical History  Procedure Date  . Abdominal hysterectomy age 48    partial  . Whole back surgery for scioliosis  rods inserted ,with last surgery 2010    surgery done x 3 times  . Cholecystectomy yrs ago  . Hernia repair yrs ago  . Right rotator cuff repair 25 to 30 yrs ago  . Tubal ligation yrs ago  . Malignant melanoa removed from right shoulder  2yrs ago  . Melignant melanoma removed from near left shin 6 yrs ago  . Bunion removed from left foot 50 yrs ago  . Lens implants both eyes 15 yrs ago    OT Assessment/Plan/Recommendation OT Assessment Clinical Impression Statement: Pt presents POD#1 LTKR. Skilled OT recommended to maximize I w/BADLs to supervision/min A level in prep for d/c to next venue of care. OT Recommendation/Assessment: Patient will need skilled OT in the acute care venue OT Problem List: Decreased activity tolerance;Decreased safety awareness;Decreased knowledge of use of DME or AE;Impaired balance (sitting and/or standing);Pain Barriers to Discharge: Inaccessible home environment;Decreased caregiver support OT Therapy Diagnosis : Generalized weakness OT Plan OT Frequency: Min 1X/week OT Treatment/Interventions: Self-care/ADL training;Therapeutic activities;DME and/or AE instruction;Patient/family  education OT Recommendation Follow Up Recommendations: Skilled nursing facility Equipment Recommended: Defer to next venue Individuals Consulted Consulted and Agree with Results and Recommendations: Patient OT Goals Acute Rehab OT Goals OT Goal Formulation: With patient Time For Goal Achievement: 2 weeks ADL Goals Pt Will Perform Grooming: with supervision;Standing at sink (X 3 tasks to improve standing activity tolerance.) ADL Goal: Grooming - Progress: Goal set today Pt Will Transfer to Toilet: with supervision;3-in-1;Stand pivot transfer;Ambulation ADL Goal: Toilet Transfer - Progress: Goal set today Pt Will Perform Toileting - Clothing Manipulation: with supervision;Standing ADL Goal: Toileting - Clothing Manipulation - Progress: Goal set today Pt Will Perform Toileting - Hygiene: with supervision;Sit to stand from 3-in-1/toilet ADL Goal: Toileting - Hygiene - Progress: Goal set today  OT Evaluation Precautions/Restrictions  Precautions Precautions: Knee Required Braces or Orthoses: Yes Knee Immobilizer: Discontinue once straight leg raise with < 10 degree lag Restrictions Weight Bearing Restrictions: No LLE Weight Bearing: Weight bearing as tolerated Prior Functioning Home Living Lives With: Alone Additional Comments: Pt planning to d/c to st [facility] at d/c Prior Function Level of Independence: Independent with basic ADLs;Requires assistive device for independence;Needs assistance with homemaking Light Housekeeping: Total ADL ADL Grooming: Simulated;Set up Where Assessed - Grooming: Sitting, chair;Supported Upper Body Bathing: Simulated;Set up Where Assessed - Upper Body Bathing: Sitting, chair;Supported Lower Body Bathing: Simulated;+1 Total assistance Where Assessed - Lower Body Bathing: Sit to stand from chair Upper Body Dressing: Simulated;Set up Where Assessed - Upper Body Dressing: Sitting, chair;Supported Lower Body Dressing: Simulated;+1 Total assistance Where  Assessed - Lower Body  Dressing: Sit to stand from chair Toilet Transfer: Simulated;Moderate assistance Toilet Transfer Details (indicate cue type and reason): cues for posture, technique, hand placement and step sequence. Toilet Transfer Method: Stand pivot Toileting - Clothing Manipulation: Simulated;+1 Total assistance Where Assessed - Toileting Clothing Manipulation: Sit to stand from 3-in-1 or toilet Toileting - Hygiene: Simulated;+1 Total assistance Where Assessed - Toileting Hygiene: Standing Tub/Shower Transfer: Not assessed Tub/Shower Transfer Method: Not assessed Ambulation Related to ADLs: Pt declined any other activity other than returning to bed. Stated she was tired and in too much pain. ADL Comments: Pt became tearful during eval stating, "I'm 78 yrs old! I dont know why I did this..." Reassured pt and positioned for comfort once BTB/ Vision/Perception    Cognition Cognition Arousal/Alertness: Awake/alert Overall Cognitive Status: Appears within functional limits for tasks assessed Orientation Level: Oriented X4 Sensation/Coordination   Extremity Assessment RUE Assessment RUE Assessment: Within Functional Limits LUE Assessment LUE Assessment: Within Functional Limits Mobility  Bed Mobility Bed Mobility: Yes Sit to Supine: 3: Mod assist;HOB flat;With rail Sit to Supine - Details (indicate cue type and reason): cues needed for technique, hand placement. Physical A needed to bring BLEs into the bed.  Transfers Transfers: Yes Sit to Stand: 4: Min assist;3: Mod assist;From chair/3-in-1;With upper extremity assist;With armrests Sit to Stand Details (indicate cue type and reason): cues for hand placement and LE position. Stand to Sit: 4: Min assist;With upper extremity assist;To bed;With armrests Stand to Sit Details: cues for hand placement and LE position. Exercises   End of Session OT - End of Session Activity Tolerance: Patient limited by pain;Patient limited by  fatigue Patient left: in bed;with call bell in reach General Behavior During Session: [facility] for tasks performed Cognition: [facility] for tasks performed   Santiago Stenzel A OTR/L [phone] 04/05/2011, 11:57 AM

## 2011-04-05 NOTE — Plan of Care (Signed)
Problem: Consults Goal: Diagnosis- Total Joint Replacement Primary Total Knee LEFT     

## 2011-04-05 NOTE — Progress Notes (Signed)
Physical Therapy Treatment Patient Details Name: Meghan Sexton MRN: [id number] DOB: 1923-08-03 Today's Date: 04/05/2011  L TKR POD #1 pm session 14:00 - 14:25 1 te  1 gt  PT Assessment/Plan  PT - Assessment/Plan Comments on Treatment Session: Performed TKR TE's while in bed, assisted pt OOB to amb then back to bed.  Pt progressing slowly.  Many family members in the room. PT Plan: Discharge plan remains appropriate Follow Up Recommendations: Skilled nursing facility Equipment Recommended: Defer to next venue PT Goals  Acute Rehab PT Goals PT Goal Formulation: With patient Pt will go Supine/Side to Sit: with supervision PT Goal: Supine/Side to Sit - Progress: Progressing toward goal Pt will go Sit to Supine/Side: with supervision PT Goal: Sit to Supine/Side - Progress: Progressing toward goal Pt will go Sit to Stand: with supervision PT Goal: Sit to Stand - Progress: Progressing toward goal Pt will go Stand to Sit: with supervision PT Goal: Stand to Sit - Progress: Progressing toward goal Pt will Ambulate: 51 - 150 feet;with supervision;with rolling walker PT Goal: Ambulate - Progress: Progressing toward goal  PT Treatment Precautions/Restrictions  Precautions Precautions: Knee Required Braces or Orthoses: Yes Knee Immobilizer: Discontinue once straight leg raise with < 10 degree lag Restrictions Weight Bearing Restrictions: No LLE Weight Bearing: Weight bearing as tolerated Mobility (including Balance) Bed Mobility Bed Mobility: Yes Supine to Sit: 3: Mod assist Supine to Sit Details (indicate cue type and reason): increased time and 25% VC's on proper tech Sit to Supine: 3: Mod assist Sit to Supine - Details (indicate cue type and reason): ncreased time and 25% VC's on proper tech Transfers Transfers: Yes Sit to Stand: 3: Mod assist;From bed Sit to Stand Details (indicate cue type and reason): increased time and 50% VC's on hand placement and L LE placement Stand to  Sit: 3: Mod assist;To bed Stand to Sit Details: increased time and 50% VC's on hand placement and L LE placement Ambulation/Gait Ambulation/Gait: Yes Ambulation/Gait Assistance: 1: +2 Total assist Ambulation/Gait Assistance Details (indicate cue type and reason): Total assist + 2 pt 60% with 75% VC's on proper walker to self placement and increase WB thru B UE's for support. Ambulation Distance (Feet): 12 Feet Assistive device: Rolling walker Gait Pattern: Step-to pattern;Decreased stance time - left;Trunk flexed Gait velocity: Pt c/o 8/10 knee pain with activity, ICE applied and repositioned. Stairs: No Wheelchair Mobility Wheelchair Mobility: No    Exercise  Total Joint Exercises Ankle Circles/Pumps: AROM;Both;10 reps;Supine Quad Sets: AROM;Both;10 reps;Supine Gluteal Sets: AROM;Both;10 reps;Supine Towel Squeeze: AROM;Both;10 reps;Supine Heel Slides: AAROM;Left;5 reps;Supine Hip ABduction/ADduction: AAROM;Left;10 reps;Supine Straight Leg Raises: AAROM;Left;5 reps;Supine End of Session PT - End of Session Equipment Utilized During Treatment: Gait belt;Left knee immobilizer Activity Tolerance: Patient limited by fatigue;Patient limited by pain Patient left: in bed;with call bell in reach;with family/visitor present General Behavior During Session: [facility] for tasks performed Cognition: [facility] for tasks performed  Felecia Shelling  PTA [facility]  Acute  Rehab Pager     [phone]

## 2011-04-05 NOTE — Progress Notes (Signed)
CSW consulted for d/c planning. Met with pt this afternoon. She would like to have ST rehab at [facility]. SNF contacted and indicated that no openings were expected this week or next. Pt has been faxed out to [facility] and bed offers will be provided when received. CSW will follow to assist with d/c planning to sNF.  Cori Razor LCSW [phone]

## 2011-04-05 NOTE — Evaluation (Signed)
Physical Therapy Evaluation Patient Details Name: Meghan Sexton MRN: [id number] DOB: [birth year] Today's Date: 04/05/2011  Problem List:  Patient Active Problem List  Diagnoses  . HYPERTENSION  . HEMORRHOIDS, EXTERNAL  . COPD  . ESOPHAGEAL STRICTURE  . GERD  . HIATAL HERNIA  . DIVERTICULOSIS, COLON  . CONSTIPATION  . DYSPHAGIA UNSPECIFIED  . OTHER DYSPHAGIA  . EPIGASTRIC PAIN  . S/P Left TKA    Past Medical History:  Past Medical History  Diagnosis Date  . Hypertension   . Asthma   . Hypothyroidism   . Constipation   . Blood transfusion 2010     1 unit after each back surgery  . Sleep apnea     stopbang=4   Past Surgical History:  Past Surgical History  Procedure Date  . Abdominal hysterectomy age 14    partial  . Whole back surgery for scioliosis  rods inserted ,with last surgery 2010    surgery done x 3 times  . Cholecystectomy yrs ago  . Hernia repair yrs ago  . Right rotator cuff repair 25 to 30 yrs ago  . Tubal ligation yrs ago  . Malignant melanoa removed from right shoulder  41yrs ago  . Melignant melanoma removed from near left shin 6 yrs ago  . Bunion removed from left foot 50 yrs ago  . Lens implants both eyes 15 yrs ago    PT Assessment/Plan/Recommendation PT Assessment Clinical Impression Statement: Pt with L TKR presents with decreased L LE strength/ROM and lrd functional mobility. PT Recommendation/Assessment: Patient will need skilled PT in the acute care venue PT Problem List: Decreased strength;Decreased range of motion;Decreased activity tolerance;Decreased mobility;Decreased knowledge of use of DME;Pain PT Therapy Diagnosis : Difficulty walking PT Plan PT Frequency: 7X/week PT Treatment/Interventions: Gait training;Stair training;DME instruction;Functional mobility training;Therapeutic activities;Therapeutic exercise;Patient/family education PT Recommendation Recommendations for Other Services: OT consult Follow Up Recommendations:  Skilled nursing facility Equipment Recommended: Defer to next venue PT Goals  Acute Rehab PT Goals PT Goal Formulation: With patient Time For Goal Achievement: 7 days Pt will go Supine/Side to Sit: with supervision PT Goal: Supine/Side to Sit - Progress: Goal set today Pt will go Sit to Supine/Side: with supervision PT Goal: Sit to Supine/Side - Progress: Goal set today Pt will go Sit to Stand: with supervision PT Goal: Sit to Stand - Progress: Goal set today Pt will go Stand to Sit: with supervision PT Goal: Stand to Sit - Progress: Goal set today Pt will Ambulate: 51 - 150 feet;with supervision;with rolling walker PT Goal: Ambulate - Progress: Goal set today  PT Evaluation Precautions/Restrictions  Precautions Precautions: Knee Required Braces or Orthoses: Yes Knee Immobilizer: Discontinue once straight leg raise with < 10 degree lag Restrictions Weight Bearing Restrictions: No LLE Weight Bearing: Weight bearing as tolerated Prior Functioning  Home Living Lives With: Alone Additional Comments: Pt planning to d/c to st snf at d/c Prior Function Level of Independence: Independent with basic ADLs;Requires assistive device for independence;Needs assistance with homemaking Light Housekeeping: Total Cognition Cognition Arousal/Alertness: Awake/alert Overall Cognitive Status: Appears within functional limits for tasks assessed Orientation Level: Oriented X4 Sensation/Coordination Coordination Gross Motor Movements are Fluid and Coordinated: Yes Extremity Assessment RUE Assessment RUE Assessment: Within Functional Limits LUE Assessment LUE Assessment: Within Functional Limits RLE Assessment RLE Assessment: Within Functional Limits LLE Assessment LLE Assessment: Exceptions to WFL (AAROM -10 - 50%; 2/5 quads) Mobility (including Balance) Bed Mobility Bed Mobility: Yes Supine to Sit: 3: Mod assist Supine to Sit Details (indicate cue  type and reason): cues for sequence and  use of UEs to self assist Sit to Supine: 3: Mod assist;HOB flat;With rail Sit to Supine - Details (indicate cue type and reason): cues needed for technique, hand placement. Physical A needed to bring BLEs into the bed.  Transfers Sit to Stand: 4: Min assist;3: Mod assist;From chair/3-in-1;With upper extremity assist;With armrests Sit to Stand Details (indicate cue type and reason): cues for hand placement and LE position. Stand to Sit: 4: Min assist;With upper extremity assist;To bed;With armrests Stand to Sit Details: cues for hand placement and LE position. Ambulation/Gait Ambulation/Gait: Yes Ambulation/Gait Assistance: 1: +2 Total assist (pt 60%) Ambulation/Gait Assistance Details (indicate cue type and reason): cues for sequence, posture, position from RW, and increased UE WB Ambulation Distance (Feet): 7 Feet Assistive device: Rolling walker Gait Pattern: Step-to pattern    Exercise  Total Joint Exercises Ankle Circles/Pumps: AROM;Both;10 reps;Supine Quad Sets: AROM;10 reps;Supine;Both Heel Slides: AAROM;Left;10 reps;Supine Straight Leg Raises: AAROM;Left;10 reps;Supine End of Session PT - End of Session Equipment Utilized During Treatment: Left knee immobilizer Activity Tolerance: Patient limited by pain;Patient limited by fatigue Patient left: in chair;with call bell in reach Nurse Communication: Mobility status for transfers;Mobility status for ambulation General Behavior During Session: [facility] for tasks performed Cognition: [facility] for tasks performed  Anala Whisenant 04/05/2011, 12:44 PM

## 2011-04-06 LAB — CBC
Hemoglobin: 10.5 g/dL — ABNORMAL LOW (ref 12.0–15.0)
Platelets: 249 10*3/uL (ref 150–400)
RBC: 3.29 MIL/uL — ABNORMAL LOW (ref 3.87–5.11)
WBC: 12.1 10*3/uL — ABNORMAL HIGH (ref 4.0–10.5)

## 2011-04-06 LAB — BASIC METABOLIC PANEL
CO2: 30 mEq/L (ref 19–32)
GFR calc non Af Amer: 77 mL/min — ABNORMAL LOW (ref >90)
Glucose, Bld: 121 mg/dL — ABNORMAL HIGH (ref 70–99)
Potassium: 3.6 mEq/L (ref 3.5–5.1)
Sodium: 137 mEq/L (ref 135–145)

## 2011-04-06 MED ORDER — FERROUS SULFATE 325 (65 FE) MG PO TABS: 325.0000 mg | ORAL_TABLET | Freq: Three times a day (TID) | ORAL | Status: DC

## 2011-04-06 MED ORDER — FENTANYL 50 MCG/HR TD PT72
50.0000 ug | MEDICATED_PATCH | TRANSDERMAL | Status: DC
  Administered 2011-04-06: 50 ug via TRANSDERMAL
  Filled 2011-04-06: qty 1

## 2011-04-06 MED ORDER — ASPIRIN EC 325 MG PO TBEC: 325.0000 mg | DELAYED_RELEASE_TABLET | Freq: Two times a day (BID) | ORAL | Status: AC

## 2011-04-06 MED ORDER — DSS 100 MG PO CAPS: 100.0000 mg | ORAL_CAPSULE | Freq: Two times a day (BID) | ORAL | Status: AC

## 2011-04-06 MED ORDER — DIPHENHYDRAMINE HCL 25 MG PO CAPS: 25.0000 mg | ORAL_CAPSULE | Freq: Four times a day (QID) | ORAL | Status: AC | PRN

## 2011-04-06 NOTE — Progress Notes (Signed)
Subjective: 2 Days Post-Op Procedure(s) (LRB): TOTAL KNEE ARTHROPLASTY (Left)   Patient reports pain as mild. Pain controlled well with medications. No other events.   Objective:   VITALS:   Filed Vitals:   04/06/11 0630  BP: 142/85  Pulse: 83  Temp: 98.8 F (37.1 C)  Resp: 16    Neurovascular intact Dorsiflexion/Plantar flexion intact Incision: dressing C/D/I No cellulitis present Compartment soft  LABS  Basename 04/06/11 0410 04/05/11 0405  HGB 10.5* 9.4*  HCT 31.1* 28.1*  WBC 12.1* 11.3*  PLT 249 204     Basename 04/06/11 0410 04/05/11 0405  NA 137 135  K 3.6 4.2  BUN 9 14  CREATININE 0.70 0.83  GLUCOSE 121* 109*     Assessment/Plan: 2 Days Post-Op Procedure(s) (LRB): TOTAL KNEE ARTHROPLASTY (Left)   Up with therapy Discharge to SNF upon discharge   Anastasio Auerbach. Abelardo Seidner   PAC  04/06/2011, 9:09 AM

## 2011-04-06 NOTE — Clinical Documentation Improvement (Signed)
Anemia Blood Loss Clarification  THIS DOCUMENT IS NOT A PERMANENT PART OF THE MEDICAL RECORD  RESPOND TO THE THIS QUERY, FOLLOW THE INSTRUCTIONS BELOW:  1. If needed, update documentation for the patient's encounter via the notes activity.  2. Access this query again and click edit on the In [employer].  3. After updating, or not, click F2 to complete all highlighted (required) fields concerning your review. Select "additional documentation in the medical record" OR "no additional documentation provided".  4. Click Sign note button.  5. The deficiency will fall out of your In Basket *Please let us know if you are not able to complete this workflow by phone or e-mail (listed below).        04/06/11  Dear Freddie Breech PA Marton Redwood  In an effort to better capture your patient's severity of illness, reflect appropriate length of stay and utilization of resources, a review of the patient medical record has revealed the following indicators.    Based on your clinical judgment, please clarify and document in a progress note and/or discharge summary the clinical condition associated with the following supporting information:  In responding to this query please exercise your independent judgment.  The fact that a query is asked, does not imply that any particular answer is desired or expected.  Pt admitted with Left knee OA / L TKA   According to lab pt's H/H range 9.4 and 10.5/ 28.1 and 31.1. Post op L TKA   Please clarify based on abnormal H/H whether or not anemia can be further specified as one of the diagnoses listed below and document in pn or d/c summary.  .   Possible Clinical Conditions?   " Expected Acute Blood Loss Anemia  " Acute Blood Loss Anemia  " Acute on chronic blood loss anemia   " Other Condition________________  " Cannot Clinically Determine  Risk Factors: (recent surgery, pre op anemia, EBL in OR)  Supporting Information:  Signs and Symptoms (unable  to ambulate, weakness, dizziness, unable to participate in care)  Diagnostics: Component     Latest Ref Rng 04/05/2011 04/06/2011  HGB     12.0 - 15.0 g/dL 9.4 (L) 21.3 (L)  HCT     36.0 - 46.0 % 28.1 (L) 31.1 (L)   Treatments: IV fluids / sodium chloride 0.9 % 1,000 mL with potassium chloride 10 mEq infusion     Serial H&H monitoring   Reviewed:  no additional documentation provided  Thank You,  Enis Slipper RN, BSN, CCDS Clinical Documentation Specialist [facility] Dept Pager: [phone] / E-mail: [email or web address] Health Information Management [place]

## 2011-04-06 NOTE — Progress Notes (Signed)
CSW assisting with d/c planning. Pt has SNF bed available at [facility] if stable for d/c. Pt/family aware and agreeable with plan. CSW will follow to assist with d/c planning to SNF.  Cori Razor  LCSW [phone]

## 2011-04-06 NOTE — Progress Notes (Signed)
Physical Therapy Treatment Patient Details Name: Meghan Sexton MRN: [id number] DOB: Jul 17, 1923 Today's Date: 04/06/2011  PT Assessment/Plan  PT - Assessment/Plan Comments on Treatment Session: pt doing better today, feeling better overall; pt amb with LLE in external rotation (pt states she has done this since her last back surgery) able to correct some with tactile and verbal cues but it increases pain PT Plan: Discharge plan remains appropriate PT Frequency: 7X/week Follow Up Recommendations: Skilled nursing facility Equipment Recommended: Defer to next venue PT Goals  Acute Rehab PT Goals Pt will go Supine/Side to Sit: with supervision PT Goal: Supine/Side to Sit - Progress: Progressing toward goal Pt will go Sit to Stand: with supervision PT Goal: Sit to Stand - Progress: Progressing toward goal Pt will go Stand to Sit: with supervision PT Goal: Stand to Sit - Progress: Progressing toward goal Pt will Ambulate: 51 - 150 feet;with supervision;with rolling walker PT Goal: Ambulate - Progress: Progressing toward goal  PT Treatment Precautions/Restrictions  Precautions Precautions: Knee Required Braces or Orthoses: Yes Knee Immobilizer: Discontinue once straight leg raise with < 10 degree lag Restrictions Weight Bearing Restrictions: No LLE Weight Bearing: Weight bearing as tolerated Mobility (including Balance) Bed Mobility Supine to Sit: 3: Mod assist;4: Min assist Supine to Sit Details (indicate cue type and reason): cues for scooting  Transfers Sit to Stand: 4: Min assist;From bed;From chair/3-in-1 Sit to Stand Details (indicate cue type and reason): cues for LE position and use of UEs Stand to Sit: 4: Min assist;To chair/3-in-1 Stand to Sit Details: same Ambulation/Gait Ambulation/Gait Assistance: 4: Min assist Ambulation/Gait Assistance Details (indicate cue type and reason): cues for sequence and Rw position Ambulation Distance (Feet): 30 Feet Assistive device:  Rolling walker Gait Pattern: Step-to pattern;Decreased stance time - left;Trunk flexed Gait velocity: Pt c/o 6/10 knee pain with activity, ICE applied and repositioned.    Exercise  Total Joint Exercises Ankle Circles/Pumps: AROM;Both;10 reps;Supine Quad Sets: AROM;Both;10 reps;Supine Towel Squeeze: AROM;Both;10 reps;Supine Heel Slides: 10 reps Hip ABduction/ADduction: AAROM;Left;10 reps;Supine Straight Leg Raises: AAROM;Left;5 reps;Supine End of Session  pt in chair with call bell in reach; [facility] 04/06/2011, 11:15 AM

## 2011-04-06 NOTE — Discharge Summary (Signed)
Physician Discharge Summary  Patient ID: Meghan Sexton MRN: [id number] DOB/AGE: 1923/10/30 78 y.o.  Admit date: 04/04/2011 Discharge date:  04/07/2011  Procedures:  Procedure(s) (LRB): TOTAL KNEE ARTHROPLASTY (Left)  Attending Physician:  Dr. Durene Romans   Admission Diagnoses: Left knee osteoarthritis  Discharge Diagnoses:  Principal Problem:  *S/P Left TKA Arthritis Hypothyroidism Hypertension  Hiatal hernia Osteoporosis Allergies Chronic pain secondary to multiple back surgeries   HPI: This is an 78 year old lady with a long history of degenerative arthritis of her left knee that is now failing conservative treatment. She has pain both day and night, and difficulty ambulating secondary to this. After discussion of treatments, benefits, risks, and options, the patient is now scheduled for total knee arthroplasty of the left knee. Note that her medical doctor is Dr. Gwen Pounds, MD. She is a candidate for tranexamic acid and will receive that preop. She is also a candidate for dexamethasone and will receive that as well. She plans on going to a nursing facility after surgery and wants to go to [facility] for her rehab. She is not given her home medications today. The surgery risks, benefits, and aftercare were discussed in detail with the patient. Questions invited and answered.  PCP: No primary provider on file.   Discharged Condition: good  Hospital Course:  Patient underwent the above stated procedure on 04/04/2011. Patient tolerated the procedure well and brought to the recovery room in good condition and subsequently to the floor.  POD #1 BP: 100/58 ; Pulse: 59 ; Temp: 97.8 F (36.6 C) ; Resp: 14 Pt's foley was removed, as well as the hemovac drain removed. IV was changed to a saline lock. Patient reports pain as mild to moderate, I think FNB may still be effective. Neurovascular intact ad incision: dressing C/D/I.  LABS  Basename  04/05/11 0405  HGB  9.4  HCT   28.1   POD #2  BP: 142/85 ; Pulse: 83 ; Temp: 98.8 F (37.1 C) ; Resp: 16  Patient reports pain as mild. Pain controlled well with medications. No other events.  Neurovascular intact, dorsiflexion/plantar flexion intact, incision: dressing C/D/I, no cellulitis present and compartment soft.   LABS  Basename  04/06/11 0410   HGB  10.5  HCT  31.1   POD #3  BP: 107/67 ; Pulse: 78 ; Temp: 98.6 F (37 C) ; Resp: 16  Patient reports pain as mild. Pain well controlled with medication. No events throughout the night. Ready for discharge to SNF. Neurovascular intact, dorsiflexion/plantar flexion intact, incision: dressing C/D/I, no cellulitis present and compartment soft.   LABS   No new labs  Discharge Exam: General appearance: alert, cooperative and no distress Extremities: Homans sign is negative, no sign of DVT, no edema, redness or tenderness in the calves or thighs and no ulcers, gangrene or trophic changes  Disposition:  SNF with follow up in 2 weeks  Follow-up Information    Follow up with OLIN,Zymire Turnbo D in 2 weeks.   Contact information:   [facility] [address] [place] [address] [phone]          Discharge Orders    Future Orders Please Complete By Expires   Diet - low sodium heart healthy      Call MD / Call 911      Comments:   If you experience chest pain or shortness of breath, CALL 911 and be transported to the hospital emergency room.  If you develope  a fever above 101 F, pus (white drainage) or increased drainage or redness at the wound, or calf pain, call your surgeon's office.   Discharge instructions      Comments:   Maintain surgical dressing for 8 days, then replace with gauze and tape. Keep the area dry and clean until follow up. Follow up in 2 weeks at [facility]. Call with any questions or concerns.     Constipation Prevention      Comments:   Drink plenty of fluids.  Prune juice may  be helpful.  You may use a stool softener, such as Colace (over the counter) 100 mg twice a day.  Use MiraLax (over the counter) for constipation as needed.   Increase activity slowly as tolerated      Weight Bearing as taught in Physical Therapy      Comments:   Use a walker or crutches as instructed.   Driving restrictions      Comments:   No driving for 4 weeks   TED hose      Comments:   Use stockings (TED hose) for 2 weeks on both leg(s).  You may remove them at night for sleeping.   Change dressing      Comments:   Maintain surgical dressing for 8 days, then change the dressing daily with sterile 4 x 4 inch gauze dressing and tape. Keep the area dry and clean.       Current Discharge Medication List    START taking these medications   Details  aspirin EC 325 MG tablet Take 1 tablet (325 mg total) by mouth 2 (two) times daily. X 4 weeks Qty: 60 tablet, Refills: 0    diphenhydrAMINE (BENADRYL) 25 mg capsule Take 1 capsule (25 mg total) by mouth every 6 (six) hours as needed for itching, allergies or sleep.    ferrous sulfate 325 (65 FE) MG tablet Take 1 tablet (325 mg total) by mouth 3 (three) times daily after meals.    HYDROcodone-acetaminophen (NORCO) 7.5-325 MG per tablet Take 1-2 tablets by mouth every 4 (four) hours as needed for pain. Qty: 120 tablet, Refills: 0      CONTINUE these medications which have CHANGED   Details  docusate sodium 100 MG CAPS Take 100 mg by mouth 2 (two) times daily.    methocarbamol (ROBAXIN) 500 MG tablet Take 1 tablet (500 mg total) by mouth every 6 (six) hours as needed (muscle spasms). Qty: 50 tablet, Refills: 0      CONTINUE these medications which have NOT CHANGED   Details  benazepril (LOTENSIN) 20 MG tablet Take 20 mg by mouth every morning.    budesonide (PULMICORT) 180 MCG/ACT inhaler Inhale 2 puffs into the lungs at bedtime.    CALCIUM & MAGNESIUM CARBONATES PO Take by mouth every morning.    cycloSPORINE (RESTASIS) 0.05  % ophthalmic emulsion Place 1 drop into both eyes every 12 (twelve) hours.    fentaNYL (DURAGESIC - DOSED MCG/HR) 50 MCG/HR Place 1 patch onto the skin every 3 (three) days.    furosemide (LASIX) 40 MG tablet Take 20-40 mg by mouth daily. May take 1 to 2 tablets per day. For leg swelling    gabapentin (NEURONTIN) 100 MG capsule Take 100 mg by mouth 2 (two) times daily.    hyoscyamine (ANASPAZ) 0.125 MG TBDP Place 0.125 mg under the tongue every 4 (four) hours as needed. May take 1 to 2 tablets. For stomach cramps.    levothyroxine (SYNTHROID, LEVOTHROID)  125 MCG tablet Take 125 mcg by mouth every morning.    Multiple Vitamin (MULITIVITAMIN WITH MINERALS) TABS Take 1 tablet by mouth every morning.    pantoprazole (PROTONIX) 40 MG tablet Take 40 mg by mouth daily. Most of the time patient takes only once per day    traZODone (DESYREL) 50 MG tablet Take 50 mg by mouth at bedtime.    vitamin C (ASCORBIC ACID) 250 MG tablet Take 1,000 mg by mouth every morning.    diazepam (VALIUM) 2 MG tablet Take 2 mg by mouth every 6 (six) hours as needed. For anxiety    hydrochlorothiazide (HYDRODIURIL) 12.5 MG tablet Take 12.5 mg by mouth every morning.    ipratropium (ATROVENT) 0.06 % nasal spray Place 2 sprays into the nose every morning.    polyethylene glycol (MIRALAX / GLYCOLAX) packet Take 17 g by mouth every morning. In orange juice    quiNINE (QUALAQUIN) 324 MG capsule Take 324 mg by mouth at bedtime.      STOP taking these medications     aspirin 81 MG chewable tablet Comments:  Reason for Stopping:       HYDROcodone-acetaminophen (VICODIN) 5-500 MG per tablet Comments:  Reason for Stopping:       meloxicam (MOBIC) 15 MG tablet Comments:  Reason for Stopping:          Signed:  Anastasio Auerbach. Latressa Harries   PAC  04/07/2011, 8:59 AM

## 2011-04-06 NOTE — Plan of Care (Signed)
Problem: Consults Goal: Diagnosis- Total Joint Replacement Outcome: Completed/Met Date Met:  04/06/11 Primary Total Knee LEFT

## 2011-04-06 NOTE — Progress Notes (Signed)
Physical Therapy Treatment Patient Details Name: Meghan Sexton MRN: [id number] DOB: 03/26/1923 Today's Date: 04/06/2011  PT Assessment/Plan  PT - Assessment/Plan Comments on Treatment Session: progressing well PT Plan: Discharge plan remains appropriate PT Frequency: 7X/week Follow Up Recommendations: Skilled nursing facility Equipment Recommended: Defer to next venue PT Goals  Acute Rehab PT Goals Pt will go Supine/Side to Sit: with supervision PT Goal: Supine/Side to Sit - Progress: Progressing toward goal Pt will go Sit to Supine/Side: with supervision PT Goal: Sit to Supine/Side - Progress: Progressing toward goal Pt will go Sit to Stand: with supervision PT Goal: Sit to Stand - Progress: Progressing toward goal Pt will go Stand to Sit: with supervision PT Goal: Stand to Sit - Progress: Progressing toward goal Pt will Ambulate: 51 - 150 feet;with supervision;with rolling walker PT Goal: Ambulate - Progress: Progressing toward goal  PT Treatment Precautions/Restrictions  Precautions Precautions: Knee Required Braces or Orthoses: Yes Knee Immobilizer: Discontinue once straight leg raise with < 10 degree lag Restrictions Weight Bearing Restrictions: No LLE Weight Bearing: Weight bearing as tolerated Mobility (including Balance) Bed Mobility Supine to Sit: 3: Mod assist;4: Min assist Supine to Sit Details (indicate cue type and reason): cues for scooting  Sit to Supine: 4: Min assist Sit to Supine - Details (indicate cue type and reason): cues for completion of task Transfers Sit to Stand: 4: Min assist;From bed Sit to Stand Details (indicate cue type and reason): cues for LE position and use of UEs Stand to Sit: 4: Min assist;To bed Stand to Sit Details: same Ambulation/Gait Ambulation/Gait Assistance: 4: Min assist Ambulation/Gait Assistance Details (indicate cue type and reason): cues for sequence and Rw position Ambulation Distance (Feet): 50 Feet Assistive  device: Rolling walker Gait Pattern: Step-to pattern Gait velocity: decreased    Exercise  Total Joint Exercises Ankle Circles/Pumps: AROM;Both;10 reps;Supine Heel Slides: 10 reps;Left Hip ABduction/ADduction: AAROM;Left;10 reps;Supine End of Session PT - End of Session Equipment Utilized During Treatment: Gait belt;Left knee immobilizer Activity Tolerance: Patient tolerated treatment well Patient left: in bed;with call bell in reach;with family/visitor present Nurse Communication: Mobility status for transfers;Mobility status for ambulation General Behavior During Session: [facility] for tasks performed Cognition: [facility] for tasks performed  [facility] 04/06/2011, 3:28 PM

## 2011-04-07 MED ORDER — METHOCARBAMOL 500 MG PO TABS: 500.0000 mg | ORAL_TABLET | Freq: Four times a day (QID) | ORAL | Status: AC | PRN

## 2011-04-07 MED ORDER — HYDROCODONE-ACETAMINOPHEN 7.5-325 MG PO TABS: 1.0000 | ORAL_TABLET | ORAL | Status: AC | PRN

## 2011-04-07 MED ORDER — HYOSCYAMINE SULFATE 0.125 MG SL SUBL
0.1250 mg | SUBLINGUAL_TABLET | SUBLINGUAL | Status: DC | PRN
  Administered 2011-04-07: 0.125 mg via SUBLINGUAL
  Filled 2011-04-07: qty 1

## 2011-04-07 NOTE — Progress Notes (Signed)
Physical Therapy Treatment Patient Details Name: Meghan Sexton MRN: [id number] DOB: [birth year] Today's Date: 04/07/2011  PT Assessment/Plan  PT - Assessment/Plan Comments on Treatment Session: progressing well PT Plan: Discharge plan remains appropriate Follow Up Recommendations: Skilled nursing facility Equipment Recommended: Defer to next venue PT Goals  Acute Rehab PT Goals Pt will go Supine/Side to Sit: with supervision PT Goal: Supine/Side to Sit - Progress: Progressing toward goal Pt will go Sit to Stand: with supervision PT Goal: Sit to Stand - Progress: Progressing toward goal Pt will go Stand to Sit: with supervision PT Goal: Stand to Sit - Progress: Progressing toward goal Pt will Ambulate: 51 - 150 feet;with supervision;with rolling walker PT Goal: Ambulate - Progress: Progressing toward goal  PT Treatment Precautions/Restrictions  Precautions Precautions: Knee Required Braces or Orthoses: Yes Knee Immobilizer: Discontinue once straight leg raise with < 10 degree lag Restrictions Weight Bearing Restrictions: No LLE Weight Bearing: Weight bearing as tolerated Mobility (including Balance) Bed Mobility Supine to Sit: 4: Min assist Transfers Sit to Stand: 4: Min assist Sit to Stand Details (indicate cue type and reason): cues for LE position and use of UEs Stand to Sit: 4: Min assist;To chair/3-in-1 Stand to Sit Details:   same  Ambulation/Gait Ambulation/Gait Assistance: 4: Min assist;5: Supervision Ambulation/Gait Assistance Details (indicate cue type and reason): cues for sequence and Rw position; cues for internal rotation LLE during stance Ambulation Distance (Feet): 80 Feet Assistive device: Rolling walker Gait Pattern: Step-to pattern Gait velocity: decreased    Exercise    End of Session PT - End of Session Equipment Utilized During Treatment: Left knee immobilizer Activity Tolerance: Patient tolerated treatment well Patient left: in chair;with  call bell in reach Nurse Communication: Mobility status for transfers;Mobility status for ambulation General Behavior During Session: [facility] for tasks performed Cognition: [facility] for tasks performed  [facility] 04/07/2011, 10:03 AM

## 2011-04-07 NOTE — Progress Notes (Signed)
Pt d/c to [facility] today via [facility] transport for [facility] placement. Pt / family were in agreement with d/c plan.  Cori Razor  LCSW  [phone]

## 2011-04-07 NOTE — Progress Notes (Signed)
Resident OOB in recliner with granddaughter at bedside. Transporters arrived to take resident to [facility]). Discharge instructions and education given to resident and she voices understanding. No acute distress noted while being placed on stretcher or exiting with transport.

## 2011-04-07 NOTE — Progress Notes (Signed)
Patient in very good spirit, joking with staff this morning. Ambulated with therapy this morning and is set to discharge to skilled nursing facility today.

## 2011-04-07 NOTE — Progress Notes (Signed)
Subjective: 3 Days Post-Op Procedure(s) (LRB): TOTAL KNEE ARTHROPLASTY (Left)   Patient reports pain as mild. Pain well controlled with medication. No events throughout the night. Ready for discharge to SNF.  Objective:   VITALS:   Filed Vitals:   04/07/11 0600  BP: 107/67  Pulse: 78  Temp: 98.6 F (37 C)  Resp: 16    Neurovascular intact Dorsiflexion/Plantar flexion intact Incision: dressing C/D/I No cellulitis present Compartment soft  LABS  Basename 04/06/11 0410 04/05/11 0405  HGB 10.5* 9.4*  HCT 31.1* 28.1*  WBC 12.1* 11.3*  PLT 249 204     Basename 04/06/11 0410 04/05/11 0405  NA 137 135  K 3.6 4.2  BUN 9 14  CREATININE 0.70 0.83  GLUCOSE 121* 109*     Assessment/Plan: 3 Days Post-Op Procedure(s) (LRB): TOTAL KNEE ARTHROPLASTY (Left)   Up with therapy Discharge to SNF today after PT Follow up in 2 weeks at [facility].  Follow-up Information    Follow up with OLIN,Rosalie Buenaventura D in 2 weeks.   Contact information:   [facility] [address] [place] [address] [phone]          Anastasio Auerbach. Kimi Bordeau   PAC  04/07/2011, 8:46 AM

## 2011-04-08 ENCOUNTER — Encounter (HOSPITAL_COMMUNITY): Payer: Self-pay | Admitting: Orthopedic Surgery

## 2012-07-14 ENCOUNTER — Encounter (HOSPITAL_COMMUNITY): Payer: Self-pay | Admitting: Emergency Medicine

## 2012-07-14 ENCOUNTER — Emergency Department (INDEPENDENT_AMBULATORY_CARE_PROVIDER_SITE_OTHER)
Admission: EM | Admit: 2012-07-14 | Discharge: 2012-07-14 | Disposition: A | Discharge status: Home / Self Care | Payer: Medicare Other | Source: Home / Self Care

## 2012-07-14 DIAGNOSIS — R05 Cough: Secondary | ICD-10-CM

## 2012-07-14 DIAGNOSIS — J189 Pneumonia, unspecified organism: Secondary | ICD-10-CM

## 2012-07-14 DIAGNOSIS — J449 Chronic obstructive pulmonary disease, unspecified: Secondary | ICD-10-CM

## 2012-07-14 DIAGNOSIS — R509 Fever, unspecified: Secondary | ICD-10-CM

## 2012-07-14 MED ORDER — METHYLPREDNISOLONE ACETATE 80 MG/ML IJ SUSP
INTRAMUSCULAR | Status: AC
  Filled 2012-07-14: qty 1

## 2012-07-14 MED ORDER — PREDNISONE (PAK) 10 MG PO TABS: 10.0000 mg | ORAL_TABLET | Freq: Every day | ORAL | Status: AC

## 2012-07-14 MED ORDER — ALBUTEROL SULFATE HFA 108 (90 BASE) MCG/ACT IN AERS: 1.0000 | INHALATION_SPRAY | Freq: Four times a day (QID) | RESPIRATORY_TRACT | Status: DC | PRN

## 2012-07-14 MED ORDER — MOXIFLOXACIN HCL 400 MG PO TABS: 400.0000 mg | ORAL_TABLET | Freq: Every day | ORAL | Status: AC

## 2012-07-14 MED ORDER — METHYLPREDNISOLONE ACETATE 80 MG/ML IJ SUSP
80.0000 mg | Freq: Once | INTRAMUSCULAR | Status: AC
  Administered 2012-07-14: 80 mg via INTRAMUSCULAR

## 2012-07-14 MED ORDER — LEVALBUTEROL HCL 1.25 MG/0.5ML IN NEBU
1.2500 mg | INHALATION_SOLUTION | Freq: Once | RESPIRATORY_TRACT | Status: AC
  Administered 2012-07-14: 1.25 mg via RESPIRATORY_TRACT

## 2012-07-14 MED ORDER — ALBUTEROL SULFATE (5 MG/ML) 0.5% IN NEBU
INHALATION_SOLUTION | RESPIRATORY_TRACT | Status: AC
  Filled 2012-07-14: qty 1

## 2012-07-14 NOTE — ED Provider Notes (Signed)
History    CSN: [id number] Arrival date & time 07/14/12  1428  None    Chief Complaint  Patient presents with  . URI   (Consider location/radiation/quality/duration/timing/severity/associated sxs/prior Treatment) HPI Comments: Meghan Sexton is a 79 yo Caucasian female with a past medical history of productive (green) cough, fevers (103), fatigue and audible wheeze for 1 week to 10 days. She actually was evaluated by Dr. Timothy Lasso on Thursday and X-rays of the chest at that time were negative. She started on Cefdinir Thursday evening and now with diarrhea also.   The history is provided by the patient.   Past Medical History  Diagnosis Date  . Hypertension   . Asthma   . Hypothyroidism   . Constipation   . Blood transfusion 2010     1 unit after each back surgery  . Sleep apnea     stopbang=4   Past Surgical History  Procedure Laterality Date  . Abdominal hysterectomy  age 35    partial  . Whole back surgery for scioliosis   rods inserted ,with last surgery 2010    surgery done x 3 times  . Cholecystectomy  yrs ago  . Hernia repair  yrs ago  . Right rotator cuff repair  25 to 30 yrs ago  . Tubal ligation  yrs ago  . Malignant melanoa removed from right shoulder   71yrs ago  . Melignant melanoma removed from near left shin  6 yrs ago  . Bunion removed from left foot  50 yrs ago  . Lens implants both eyes  15 yrs ago  . Total knee arthroplasty  04/04/2011    Procedure: TOTAL KNEE ARTHROPLASTY;  Surgeon: [name], MD;  Location: [facility];  Service: Orthopedics;  Laterality: Left;   History reviewed. No pertinent family history. History  Substance Use Topics  . Smoking status: Never Smoker   . Smokeless tobacco: Never Used  . Alcohol Use: No   OB History   Grav Para Term Preterm Abortions TAB SAB Ect Mult Living                 Review of Systems  Constitutional: Positive for fever, chills and fatigue.  HENT: Negative for neck pain.   Eyes: Negative.   Respiratory:  Positive for cough, shortness of breath and wheezing. Negative for apnea, choking, chest tightness and stridor.   Cardiovascular: Negative.   Gastrointestinal: Negative.   Endocrine: Negative.   Genitourinary: Negative.   Musculoskeletal: Negative.     Allergies  Codeine; Penicillins; and Sulfonamide derivatives  Home Medications   Current Outpatient Rx  Name  Route  Sig  Dispense  Refill  . albuterol (PROVENTIL HFA;VENTOLIN HFA) 108 (90 BASE) MCG/ACT inhaler   Inhalation   Inhale 1-2 puffs into the lungs every 6 (six) hours as needed for wheezing (Use q 6 hours for next 3 days then prn).   1 Inhaler   0   . benazepril (LOTENSIN) 20 MG tablet   Oral   Take 20 mg by mouth every morning.         . budesonide (PULMICORT) 180 MCG/ACT inhaler   Inhalation   Inhale 2 puffs into the lungs at bedtime.         Marland Kitchen CALCIUM & MAGNESIUM CARBONATES PO   Oral   Take by mouth every morning.         . cycloSPORINE (RESTASIS) 0.05 % ophthalmic emulsion   Both Eyes   Place 1 drop into both  eyes every 12 (twelve) hours.         . diazepam (VALIUM) 2 MG tablet   Oral   Take 2 mg by mouth every 6 (six) hours as needed. For anxiety         . fentaNYL (DURAGESIC - DOSED MCG/HR) 50 MCG/HR   Transdermal   Place 1 patch onto the skin every 3 (three) days.         . furosemide (LASIX) 40 MG tablet   Oral   Take 20-40 mg by mouth daily. May take 1 to 2 tablets per day. For leg swelling         . gabapentin (NEURONTIN) 100 MG capsule   Oral   Take 100 mg by mouth 2 (two) times daily.         . hydrochlorothiazide (HYDRODIURIL) 12.5 MG tablet   Oral   Take 12.5 mg by mouth every morning.         . hyoscyamine (ANASPAZ) 0.125 MG TBDP   Sublingual   Place 0.125 mg under the tongue every 4 (four) hours as needed. May take 1 to 2 tablets. For stomach cramps.         Marland Kitchen ipratropium (ATROVENT) 0.06 % nasal spray   Nasal   Place 2 sprays into the nose every morning.          Marland Kitchen levothyroxine (SYNTHROID, LEVOTHROID) 125 MCG tablet   Oral   Take 125 mcg by mouth every morning.         . moxifloxacin (AVELOX) 400 MG tablet   Oral   Take 1 tablet (400 mg total) by mouth daily.   10 tablet   0   . Multiple Vitamin (MULITIVITAMIN WITH MINERALS) TABS   Oral   Take 1 tablet by mouth every morning.         . pantoprazole (PROTONIX) 40 MG tablet   Oral   Take 40 mg by mouth daily. Most of the time patient takes only once per day         . polyethylene glycol (MIRALAX / GLYCOLAX) packet   Oral   Take 17 g by mouth every morning. In orange juice         . predniSONE (STERAPRED UNI-PAK) 10 MG tablet   Oral   Take 1 tablet (10 mg total) by mouth daily. Take as directed on package for prednisone pack   48 tablet   0   . traZODone (DESYREL) 50 MG tablet   Oral   Take 50 mg by mouth at bedtime.         . vitamin C (ASCORBIC ACID) 250 MG tablet   Oral   Take 1,000 mg by mouth every morning.          BP 123/69  Pulse 75  Temp(Src) 99.8 F (37.7 C) (Oral)  Resp 18  SpO2 96% Physical Exam  Nursing note and vitals reviewed. Constitutional: She is oriented to person, place, and time. She appears well-developed and well-nourished. No distress.  Presents in W/C with daughter  HENT:  Head: Normocephalic and atraumatic.  Neck: Neck supple.  Cardiovascular: Normal rate, regular rhythm and normal heart sounds.   Pulmonary/Chest: Effort normal. No respiratory distress. She has wheezes. She has no rales. She exhibits no tenderness.  Crackles and congestion in both mid to lower lung fields  Neurological: She is alert and oriented to person, place, and time.  Skin: Skin is warm and dry.  Psychiatric: Her behavior is normal.  ED Course  Procedures (including critical care time) Labs Reviewed - No data to display No results found. 1. Community acquired pneumonia   2. COPD   3. Cough   4. Fever     MDM  Fever, productive cough and  crackles would suggest PNA, XRAY negative at PCP on Thursday Treat for PNA/Cough with f/u by PCP. Change ABX based on diarrhea, add MDI, Steroids  Azucena Fallen, PA-C 07/14/12 8119

## 2012-07-14 NOTE — ED Provider Notes (Signed)
Medical screening examination/treatment/procedure(s) were performed by non-physician practitioner and as supervising physician I was immediately available for consultation/collaboration.  Leslee Home, M.D.  Reuben Likes, MD 07/14/12 [phone]

## 2012-07-14 NOTE — ED Notes (Signed)
States she is having flu like symptoms. Patient states she went to see her doctor on Thursday and he ran chest xray which was negative. Later that night she got worst.  States she has been nausea, fever, and coughing.  Primary gave her cefdinir, benzonate, ondansetran

## 2012-10-10 NOTE — H&P (Signed)
TOTAL HIP ADMISSION H&P  Patient is admitted for left total hip arthroplasty, anterior approach.  Subjective:  Chief Complaint:    Left hip OA / pain  HPI: Meghan Sexton, 79 y.o. female, has a history of pain and functional disability in the left hip(s) due to arthritis and patient has failed non-surgical conservative treatments for greater than 12 weeks to include NSAID's and/or analgesics, corticosteriod injections, use of assistive devices and activity modification.  Onset of symptoms was gradual starting years ago with rapidlly worsening course since that time.The patient noted no past surgery on the left hip(s).  Patient currently rates pain in the left hip at 8 out of 10 with activity. Patient has night pain, worsening of pain with activity and weight bearing, trendelenberg gait, pain that interfers with activities of daily living, pain with passive range of motion and crepitus. Patient has evidence of periarticular osteophytes and joint space narrowing by imaging studies. This condition presents safety issues increasing the risk of falls.  There is no current active infection.  Risks, benefits and expectations were discussed with the patient. Patient understand the risks, benefits and expectations and wishes to proceed with surgery.   D/C Plans:   SNF ([facility])  Post-op Meds:    No Rx given  Tranexamic Acid:    To be given  Decadron:    To be given  FYI:    ASA post-op   Norco post-op  Patient Active Problem List   Diagnosis Date Noted  . S/P Left TKA 04/04/2011  . HYPERTENSION 03/12/2008  . COPD 03/12/2008  . CONSTIPATION 03/12/2008  . OTHER DYSPHAGIA 03/12/2008  . ESOPHAGEAL STRICTURE 03/23/2007  . DYSPHAGIA UNSPECIFIED 03/23/2007  . EPIGASTRIC PAIN 03/23/2007  . HEMORRHOIDS, EXTERNAL 11/01/2006  . GERD 11/01/2006  . HIATAL HERNIA 11/01/2006  . DIVERTICULOSIS, COLON 11/01/2006   Past Medical History  Diagnosis Date  . Hypertension   . Asthma   . Hypothyroidism   .  Constipation   . Blood transfusion 2010     1 unit after each back surgery  . Sleep apnea     stopbang=4    Past Surgical History  Procedure Laterality Date  . Abdominal hysterectomy  age 6    partial  . Whole back surgery for scioliosis   rods inserted ,with last surgery 2010    surgery done x 3 times  . Cholecystectomy  yrs ago  . Hernia repair  yrs ago  . Right rotator cuff repair  25 to 30 yrs ago  . Tubal ligation  yrs ago  . Malignant melanoa removed from right shoulder   47yrs ago  . Melignant melanoma removed from near left shin  6 yrs ago  . Bunion removed from left foot  50 yrs ago  . Lens implants both eyes  15 yrs ago  . Total knee arthroplasty  04/04/2011    Procedure: TOTAL KNEE ARTHROPLASTY;  Surgeon: [name], MD;  Location: [facility];  Service: Orthopedics;  Laterality: Left;    No prescriptions prior to admission   Allergies  Allergen Reactions  . Codeine Nausea And Vomiting  . Penicillins Other (See Comments)    Nodules in legs   . Sulfonamide Derivatives Nausea Only    History  Substance Use Topics  . Smoking status: Never Smoker   . Smokeless tobacco: Never Used  . Alcohol Use: No    No family history on file.   Review of Systems  Constitutional: Positive for malaise/fatigue.  HENT: Positive  for hearing loss.   Eyes: Negative.   Respiratory: Positive for shortness of breath (on exertion).   Cardiovascular: Negative.   Gastrointestinal: Positive for constipation.  Genitourinary: Negative.   Musculoskeletal: Positive for myalgias, back pain and joint pain.  Skin: Negative.   Neurological: Negative.   Endo/Heme/Allergies: Positive for environmental allergies.  Psychiatric/Behavioral: Negative.     Objective:  Physical Exam  Constitutional: She is oriented to person, place, and time. She appears well-developed and well-nourished.  HENT:  Head: Normocephalic and atraumatic.  Mouth/Throat: Oropharynx is clear and moist.  Eyes: Pupils are  equal, round, and reactive to light.  Neck: Neck supple. No JVD present. No tracheal deviation present. No thyromegaly present.  Cardiovascular: Normal rate, regular rhythm and intact distal pulses.   Respiratory: Effort normal and breath sounds normal. No stridor. No respiratory distress. She has no wheezes.  GI: Soft. There is no tenderness. There is no guarding.  Musculoskeletal:       Left hip: She exhibits decreased range of motion, decreased strength, tenderness, bony tenderness and crepitus. She exhibits no swelling, no deformity and no laceration.  Lymphadenopathy:    She has no cervical adenopathy.  Neurological: She is alert and oriented to person, place, and time.  Skin: Skin is warm and dry.  Psychiatric: She has a normal mood and affect.    Labs:  Estimated body mass index is 34.61 kg/(m^2) as calculated from the following:   Height as of 04/04/11: 4\' 7"  (1.397 m).   Weight as of 03/28/11: 67.541 kg (148 lb 14.4 oz).   Imaging Review Plain radiographs demonstrate severe degenerative joint disease of the left hip(s). The bone quality appears to be good for age and reported activity level.  Assessment/Plan:  End stage arthritis, left hip(s)  The patient history, physical examination, clinical judgement of the provider and imaging studies are consistent with end stage degenerative joint disease of the left hip(s) and total hip arthroplasty is deemed medically necessary. The treatment options including medical management, injection therapy, arthroscopy and arthroplasty were discussed at length. The risks and benefits of total hip arthroplasty were presented and reviewed. The risks due to aseptic loosening, infection, stiffness, dislocation/subluxation,  thromboembolic complications and other imponderables were discussed.  The patient acknowledged the explanation, agreed to proceed with the plan and consent was signed. Patient is being admitted for inpatient treatment for surgery,  pain control, PT, OT, prophylactic antibiotics, VTE prophylaxis, progressive ambulation and ADL's and discharge planning.The patient is planning to be discharged to skilled nursing facility.     Anastasio Auerbach Safi Culotta   PAC  10/10/2012, 1:19 PM

## 2012-10-12 ENCOUNTER — Encounter (HOSPITAL_COMMUNITY): Payer: Self-pay | Admitting: Pharmacy Technician

## 2012-10-12 ENCOUNTER — Other Ambulatory Visit (HOSPITAL_COMMUNITY): Payer: Self-pay | Admitting: Orthopedic Surgery

## 2012-10-12 NOTE — Patient Instructions (Addendum)
20 Meghan Sexton  10/12/2012   Your procedure is scheduled on:  10/23/12  TUESDAY  Report to [facility] at 0500      AM.  Call this number if you have problems the morning of surgery: [phone]       Remember: [name] WITH YOU TO HOSPITAL  Do not eat food  Or drink :After Midnight. Monday NIGHT   Take these medicines the morning of surgery with A SIP OF WATER: Neurontin(gabopentin), Methocarbamol, Levothyroxine, Pantoprazole , Pulmicort, Atrovent .May use albuterol inhaler if needed , may take Norco if needed   .  Contacts, dentures or partial plates can not be worn to surgery  Leave suitcase in the car. After surgery it may be brought to your room.  For patients admitted to the hospital, checkout time is 11:00 AM day of  discharge.             SPECIAL INSTRUCTIONS- SEE [place] PREPARING FOR SURGERY INSTRUCTION SHEET-     DO NOT WEAR JEWELRY, LOTIONS, POWDERS, OR PERFUMES.  WOMEN-- DO NOT SHAVE LEGS OR UNDERARMS FOR 12 HOURS BEFORE SHOWERS. MEN MAY SHAVE FACE.  Patients discharged the day of surgery will not be allowed to drive home. IF going home the day of surgery, you must have a driver and someone to stay with you for the first 24 hours  Name and phone number of your driver:  admission                                                                      Please read over the following fact sheets that you were given: MRSA Information, Incentive Spirometry Sheet, Blood Transfusion Sheet  Information                                                                                   Telford Nab.Georgeanna Lea, RN, BSN  PST [phone]                 FAILURE TO FOLLOW THESE INSTRUCTIONS MAY RESULT IN  CANCELLATION   OF YOUR SURGERY                                                  Patient Signature _____________________________

## 2012-10-12 NOTE — Progress Notes (Signed)
Clearance Dr Timothy Lasso chart

## 2012-10-15 ENCOUNTER — Encounter (HOSPITAL_COMMUNITY): Payer: Self-pay

## 2012-10-15 ENCOUNTER — Ambulatory Visit (HOSPITAL_COMMUNITY)
Admission: RE | Admit: 2012-10-15 | Discharge: 2012-10-15 | Disposition: A | Discharge status: Home / Self Care | Payer: Medicare Other | Source: Ambulatory Visit | Attending: Orthopedic Surgery | Admitting: Orthopedic Surgery

## 2012-10-15 ENCOUNTER — Encounter (HOSPITAL_COMMUNITY)
Admission: RE | Admit: 2012-10-15 | Discharge: 2012-10-15 | Disposition: A | Discharge status: Home / Self Care | Payer: Medicare Other | Source: Ambulatory Visit | Attending: Orthopedic Surgery | Admitting: Orthopedic Surgery

## 2012-10-15 DIAGNOSIS — Z01812 Encounter for preprocedural laboratory examination: Secondary | ICD-10-CM | POA: Insufficient documentation

## 2012-10-15 DIAGNOSIS — Z01818 Encounter for other preprocedural examination: Secondary | ICD-10-CM | POA: Insufficient documentation

## 2012-10-15 DIAGNOSIS — M169 Osteoarthritis of hip, unspecified: Secondary | ICD-10-CM | POA: Insufficient documentation

## 2012-10-15 DIAGNOSIS — Z01810 Encounter for preprocedural cardiovascular examination: Secondary | ICD-10-CM | POA: Insufficient documentation

## 2012-10-15 DIAGNOSIS — M1610 Unilateral primary osteoarthritis, unspecified hip: Secondary | ICD-10-CM | POA: Insufficient documentation

## 2012-10-15 HISTORY — DX: Major depressive disorder, single episode, unspecified: F32.9

## 2012-10-15 HISTORY — DX: Gastro-esophageal reflux disease without esophagitis: K21.9

## 2012-10-15 HISTORY — DX: Anxiety disorder, unspecified: F41.9

## 2012-10-15 HISTORY — DX: Depression, unspecified: F32.A

## 2012-10-15 HISTORY — DX: Unspecified osteoarthritis, unspecified site: M19.90

## 2012-10-15 LAB — APTT: aPTT: 29 seconds (ref 24–37)

## 2012-10-15 LAB — URINALYSIS, ROUTINE W REFLEX MICROSCOPIC
Bilirubin Urine: NEGATIVE
Glucose, UA: NEGATIVE mg/dL
Nitrite: NEGATIVE
Specific Gravity, Urine: 1.018 (ref 1.005–1.030)
Urobilinogen, UA: 0.2 mg/dL (ref 0.0–1.0)

## 2012-10-15 LAB — BASIC METABOLIC PANEL
BUN: 11 mg/dL (ref 6–23)
Calcium: 10.6 mg/dL — ABNORMAL HIGH (ref 8.4–10.5)
GFR calc Af Amer: 89 mL/min — ABNORMAL LOW (ref >90)
GFR calc non Af Amer: 77 mL/min — ABNORMAL LOW (ref >90)
Potassium: 4.7 mEq/L (ref 3.5–5.1)
Sodium: 134 mEq/L — ABNORMAL LOW (ref 135–145)

## 2012-10-15 LAB — SURGICAL PCR SCREEN
MRSA, PCR: NEGATIVE
Staphylococcus aureus: NEGATIVE

## 2012-10-15 LAB — CBC
Hemoglobin: 11.7 g/dL — ABNORMAL LOW (ref 12.0–15.0)
MCHC: 33.6 g/dL (ref 30.0–36.0)
RBC: 3.79 MIL/uL — ABNORMAL LOW (ref 3.87–5.11)
RDW: 13.5 % (ref 11.5–15.5)
WBC: 8.3 10*3/uL (ref 4.0–10.5)

## 2012-10-15 LAB — PROTIME-INR
INR: 0.94 (ref 0.00–1.49)
Prothrombin Time: 12.4 seconds (ref 11.6–15.2)

## 2012-10-15 LAB — URINE MICROSCOPIC-ADD ON

## 2012-10-15 NOTE — Progress Notes (Signed)
10/15/12 1330  OBSTRUCTIVE SLEEP APNEA  Have you ever been diagnosed with sleep apnea through a sleep study? No  Do you snore loudly (loud enough to be heard through closed doors)?  1  Do you often feel tired, fatigued, or sleepy during the daytime? 1  Has anyone observed you stop breathing during your sleep? 0  Do you have, or are you being treated for high blood pressure? 1  Age over 77 years old? 1  Neck circumference greater than 40 cm/18 inches? 0  Gender: 0  Obstructive Sleep Apnea Score 4  Score 4 or greater  Results sent to PCP

## 2012-10-22 NOTE — Anesthesia Preprocedure Evaluation (Addendum)
Anesthesia Evaluation  Patient identified by MRN, date of birth, ID band Patient awake    Reviewed: Allergy & Precautions, H&P , NPO status , Patient's Chart, lab work & pertinent test results  Airway Mallampati: II TM Distance: >3 FB Neck ROM: full    Dental  (+) Caps and Dental Advisory Given All teeth are capped:   Pulmonary asthma , sleep apnea , COPD COPD inhaler,  Stop bang 4 breath sounds clear to auscultation  Pulmonary exam normal       Cardiovascular Exercise Tolerance: Good hypertension, Pt. on medications negative cardio ROS  Rhythm:regular Rate:Normal     Neuro/Psych Anxiety Depression Scoliosis back surgery x 2 with rods  Neuromuscular disease negative neurological ROS  negative psych ROS   GI/Hepatic negative GI ROS, Neg liver ROS, Esophageal stricture   Endo/Other  negative endocrine ROSHypothyroidism   Renal/GU negative Renal ROS  negative genitourinary   Musculoskeletal   Abdominal   Peds  Hematology negative hematology ROS (+)   Anesthesia Other Findings   Reproductive/Obstetrics negative OB ROS                          Anesthesia Physical Anesthesia Plan  ASA: III  Anesthesia Plan: General   Post-op Pain Management:    Induction: Intravenous  Airway Management Planned: Oral ETT  Additional Equipment:   Intra-op Plan:   Post-operative Plan: Extubation in OR  Informed Consent: I have reviewed the patients History and Physical, chart, labs and discussed the procedure including the risks, benefits and alternatives for the proposed anesthesia with the patient or authorized representative who has indicated his/her understanding and acceptance.   Dental Advisory Given  Plan Discussed with: CRNA and Surgeon  Anesthesia Plan Comments:         Anesthesia Quick Evaluation

## 2012-10-23 ENCOUNTER — Inpatient Hospital Stay (HOSPITAL_COMMUNITY): Payer: Medicare Other | Admitting: Anesthesiology

## 2012-10-23 ENCOUNTER — Inpatient Hospital Stay (HOSPITAL_COMMUNITY): Payer: Medicare Other

## 2012-10-23 ENCOUNTER — Encounter (HOSPITAL_COMMUNITY)
Admission: RE | Disposition: A | Discharge status: Skilled Nursing Facility | Payer: Self-pay | Source: Ambulatory Visit | Attending: Orthopedic Surgery

## 2012-10-23 ENCOUNTER — Inpatient Hospital Stay (HOSPITAL_COMMUNITY)
Admission: RE | Admit: 2012-10-23 | Discharge: 2012-10-26 | DRG: 470 | Disposition: A | Discharge status: Skilled Nursing Facility | Payer: Medicare Other | Source: Ambulatory Visit | Attending: Orthopedic Surgery | Admitting: Orthopedic Surgery

## 2012-10-23 ENCOUNTER — Encounter (HOSPITAL_COMMUNITY): Payer: Medicare Other | Admitting: Anesthesiology

## 2012-10-23 ENCOUNTER — Encounter (HOSPITAL_COMMUNITY): Payer: Self-pay | Admitting: *Deleted

## 2012-10-23 DIAGNOSIS — K219 Gastro-esophageal reflux disease without esophagitis: Secondary | ICD-10-CM | POA: Diagnosis present

## 2012-10-23 DIAGNOSIS — F3289 Other specified depressive episodes: Secondary | ICD-10-CM | POA: Diagnosis present

## 2012-10-23 DIAGNOSIS — F329 Major depressive disorder, single episode, unspecified: Secondary | ICD-10-CM | POA: Diagnosis present

## 2012-10-23 DIAGNOSIS — D62 Acute posthemorrhagic anemia: Secondary | ICD-10-CM | POA: Diagnosis not present

## 2012-10-23 DIAGNOSIS — M1610 Unilateral primary osteoarthritis, unspecified hip: Principal | ICD-10-CM | POA: Diagnosis present

## 2012-10-23 DIAGNOSIS — G4730 Sleep apnea, unspecified: Secondary | ICD-10-CM | POA: Diagnosis present

## 2012-10-23 DIAGNOSIS — E663 Overweight: Secondary | ICD-10-CM | POA: Diagnosis present

## 2012-10-23 DIAGNOSIS — E039 Hypothyroidism, unspecified: Secondary | ICD-10-CM | POA: Diagnosis present

## 2012-10-23 DIAGNOSIS — Z96649 Presence of unspecified artificial hip joint: Secondary | ICD-10-CM

## 2012-10-23 DIAGNOSIS — M169 Osteoarthritis of hip, unspecified: Principal | ICD-10-CM | POA: Diagnosis present

## 2012-10-23 DIAGNOSIS — J45909 Unspecified asthma, uncomplicated: Secondary | ICD-10-CM | POA: Diagnosis present

## 2012-10-23 DIAGNOSIS — I10 Essential (primary) hypertension: Secondary | ICD-10-CM | POA: Diagnosis present

## 2012-10-23 DIAGNOSIS — Z6827 Body mass index (BMI) 27.0-27.9, adult: Secondary | ICD-10-CM

## 2012-10-23 DIAGNOSIS — F411 Generalized anxiety disorder: Secondary | ICD-10-CM | POA: Diagnosis present

## 2012-10-23 HISTORY — PX: TOTAL HIP ARTHROPLASTY: SHX124

## 2012-10-23 SURGERY — ARTHROPLASTY, HIP, TOTAL, ANTERIOR APPROACH
Anesthesia: General | Site: Hip | Laterality: Left | Wound class: Clean

## 2012-10-23 MED ORDER — QUININE SULFATE 324 MG PO CAPS
648.0000 mg | ORAL_CAPSULE | Freq: Three times a day (TID) | ORAL | Status: DC | PRN
  Filled 2012-10-23: qty 2

## 2012-10-23 MED ORDER — TRANEXAMIC ACID 100 MG/ML IV SOLN
1000.0000 mg | Freq: Once | INTRAVENOUS | Status: AC
  Administered 2012-10-23: 1000 mg via INTRAVENOUS
  Filled 2012-10-23: qty 10

## 2012-10-23 MED ORDER — DIPHENHYDRAMINE HCL 12.5 MG/5ML PO ELIX: 25.0000 mg | ORAL_SOLUTION | Freq: Four times a day (QID) | ORAL | Status: DC | PRN

## 2012-10-23 MED ORDER — NEOSTIGMINE METHYLSULFATE 1 MG/ML IJ SOLN
INTRAMUSCULAR | Status: DC | PRN
  Administered 2012-10-23: 3 mg via INTRAVENOUS

## 2012-10-23 MED ORDER — HYDROMORPHONE HCL PF 1 MG/ML IJ SOLN
INTRAMUSCULAR | Status: AC
  Filled 2012-10-23: qty 1

## 2012-10-23 MED ORDER — LIDOCAINE HCL (CARDIAC) 20 MG/ML IV SOLN
INTRAVENOUS | Status: DC | PRN
  Administered 2012-10-23: 40 mg via INTRAVENOUS

## 2012-10-23 MED ORDER — HYOSCYAMINE SULFATE 0.125 MG PO TBDP
0.1250 mg | ORAL_TABLET | ORAL | Status: DC | PRN
  Filled 2012-10-23: qty 1

## 2012-10-23 MED ORDER — LEVOTHYROXINE SODIUM 125 MCG PO TABS
125.0000 ug | ORAL_TABLET | Freq: Every day | ORAL | Status: DC
  Administered 2012-10-24 – 2012-10-26 (×3): 125 ug via ORAL
  Filled 2012-10-23 (×5): qty 1

## 2012-10-23 MED ORDER — HYDROMORPHONE HCL PF 1 MG/ML IJ SOLN
0.2500 mg | INTRAMUSCULAR | Status: DC | PRN
  Administered 2012-10-23 (×3): 0.5 mg via INTRAVENOUS

## 2012-10-23 MED ORDER — ROCURONIUM BROMIDE 100 MG/10ML IV SOLN
INTRAVENOUS | Status: DC | PRN
  Administered 2012-10-23: 35 mg via INTRAVENOUS

## 2012-10-23 MED ORDER — CYCLOSPORINE 0.05 % OP EMUL
1.0000 [drp] | Freq: Two times a day (BID) | OPHTHALMIC | Status: DC
  Administered 2012-10-23 – 2012-10-26 (×6): 1 [drp] via OPHTHALMIC
  Filled 2012-10-23 (×8): qty 1

## 2012-10-23 MED ORDER — HYDROCHLOROTHIAZIDE 25 MG PO TABS
12.5000 mg | ORAL_TABLET | Freq: Every day | ORAL | Status: DC
  Filled 2012-10-23: qty 0.5

## 2012-10-23 MED ORDER — ASPIRIN EC 325 MG PO TBEC
325.0000 mg | DELAYED_RELEASE_TABLET | Freq: Two times a day (BID) | ORAL | Status: DC
  Administered 2012-10-23 – 2012-10-26 (×6): 325 mg via ORAL
  Filled 2012-10-23 (×7): qty 1

## 2012-10-23 MED ORDER — PHENOL 1.4 % MT LIQD
1.0000 | OROMUCOSAL | Status: DC | PRN
  Filled 2012-10-23: qty 177

## 2012-10-23 MED ORDER — FLUTICASONE PROPIONATE HFA 44 MCG/ACT IN AERO
2.0000 | INHALATION_SPRAY | Freq: Every day | RESPIRATORY_TRACT | Status: DC
  Administered 2012-10-23 – 2012-10-25 (×3): 2 via RESPIRATORY_TRACT
  Filled 2012-10-23: qty 10.6

## 2012-10-23 MED ORDER — GLYCOPYRROLATE 0.2 MG/ML IJ SOLN
INTRAMUSCULAR | Status: DC | PRN
  Administered 2012-10-23: 0.4 mg via INTRAVENOUS

## 2012-10-23 MED ORDER — PROPOFOL 10 MG/ML IV BOLUS
INTRAVENOUS | Status: DC | PRN
  Administered 2012-10-23: 100 mg via INTRAVENOUS

## 2012-10-23 MED ORDER — ALBUTEROL SULFATE HFA 108 (90 BASE) MCG/ACT IN AERS
1.0000 | INHALATION_SPRAY | Freq: Four times a day (QID) | RESPIRATORY_TRACT | Status: DC | PRN
  Filled 2012-10-23: qty 6.7

## 2012-10-23 MED ORDER — DOCUSATE SODIUM 100 MG PO CAPS
100.0000 mg | ORAL_CAPSULE | Freq: Two times a day (BID) | ORAL | Status: DC
  Administered 2012-10-23 – 2012-10-26 (×5): 100 mg via ORAL

## 2012-10-23 MED ORDER — SODIUM CHLORIDE 0.9 % IR SOLN
Status: DC | PRN
  Administered 2012-10-23: 1000 mL

## 2012-10-23 MED ORDER — PROMETHAZINE HCL 25 MG/ML IJ SOLN
12.5000 mg | INTRAMUSCULAR | Status: DC | PRN
  Administered 2012-10-23: 12.5 mg via INTRAVENOUS

## 2012-10-23 MED ORDER — CLINDAMYCIN PHOSPHATE 900 MG/50ML IV SOLN
INTRAVENOUS | Status: AC
  Filled 2012-10-23: qty 50

## 2012-10-23 MED ORDER — ONDANSETRON HCL 4 MG/2ML IJ SOLN: 4.0000 mg | Freq: Four times a day (QID) | INTRAMUSCULAR | Status: DC | PRN

## 2012-10-23 MED ORDER — GABAPENTIN 100 MG PO CAPS
100.0000 mg | ORAL_CAPSULE | Freq: Two times a day (BID) | ORAL | Status: DC
  Administered 2012-10-23 – 2012-10-26 (×6): 100 mg via ORAL
  Filled 2012-10-23 (×7): qty 1

## 2012-10-23 MED ORDER — PROMETHAZINE HCL 25 MG/ML IJ SOLN
INTRAMUSCULAR | Status: AC
  Filled 2012-10-23: qty 1

## 2012-10-23 MED ORDER — FERROUS SULFATE 325 (65 FE) MG PO TABS
325.0000 mg | ORAL_TABLET | Freq: Three times a day (TID) | ORAL | Status: DC
  Administered 2012-10-24 – 2012-10-26 (×7): 325 mg via ORAL
  Filled 2012-10-23 (×12): qty 1

## 2012-10-23 MED ORDER — KETAMINE HCL 50 MG/ML IJ SOLN
INTRAMUSCULAR | Status: DC | PRN
  Administered 2012-10-23 (×4): 10 mg via INTRAMUSCULAR

## 2012-10-23 MED ORDER — ONDANSETRON HCL 4 MG/2ML IJ SOLN
INTRAMUSCULAR | Status: DC | PRN
  Administered 2012-10-23: 4 mg via INTRAMUSCULAR

## 2012-10-23 MED ORDER — TRAZODONE HCL 50 MG PO TABS
50.0000 mg | ORAL_TABLET | Freq: Every day | ORAL | Status: DC
  Administered 2012-10-23 – 2012-10-25 (×3): 50 mg via ORAL
  Filled 2012-10-23 (×4): qty 1

## 2012-10-23 MED ORDER — DEXAMETHASONE SODIUM PHOSPHATE 10 MG/ML IJ SOLN: 10.0000 mg | Freq: Once | INTRAMUSCULAR | Status: DC

## 2012-10-23 MED ORDER — LACTATED RINGERS IV SOLN: INTRAVENOUS | Status: DC

## 2012-10-23 MED ORDER — MENTHOL 3 MG MT LOZG
1.0000 | LOZENGE | OROMUCOSAL | Status: DC | PRN
  Filled 2012-10-23: qty 9

## 2012-10-23 MED ORDER — ONDANSETRON HCL 4 MG PO TABS: 4.0000 mg | ORAL_TABLET | Freq: Four times a day (QID) | ORAL | Status: DC | PRN

## 2012-10-23 MED ORDER — DEXAMETHASONE SODIUM PHOSPHATE 10 MG/ML IJ SOLN
INTRAMUSCULAR | Status: DC | PRN
  Administered 2012-10-23: 10 mg via INTRAVENOUS

## 2012-10-23 MED ORDER — HYDROCHLOROTHIAZIDE 12.5 MG PO CAPS
12.5000 mg | ORAL_CAPSULE | Freq: Every day | ORAL | Status: DC
  Administered 2012-10-23 – 2012-10-26 (×4): 12.5 mg via ORAL
  Filled 2012-10-23 (×4): qty 1

## 2012-10-23 MED ORDER — IPRATROPIUM BROMIDE 0.06 % NA SOLN
2.0000 | Freq: Every day | NASAL | Status: DC
  Administered 2012-10-24: 2 via NASAL
  Filled 2012-10-23: qty 15

## 2012-10-23 MED ORDER — CLINDAMYCIN PHOSPHATE 900 MG/50ML IV SOLN
900.0000 mg | INTRAVENOUS | Status: AC
  Administered 2012-10-23: 900 mg via INTRAVENOUS

## 2012-10-23 MED ORDER — FENTANYL 50 MCG/HR TD PT72
50.0000 ug | MEDICATED_PATCH | TRANSDERMAL | Status: DC
  Administered 2012-10-23 – 2012-10-26 (×2): 50 ug via TRANSDERMAL
  Filled 2012-10-23 (×2): qty 1

## 2012-10-23 MED ORDER — METHOCARBAMOL 100 MG/ML IJ SOLN
500.0000 mg | Freq: Four times a day (QID) | INTRAVENOUS | Status: DC | PRN
  Administered 2012-10-23: 500 mg via INTRAVENOUS
  Filled 2012-10-23: qty 5

## 2012-10-23 MED ORDER — SODIUM CHLORIDE 0.9 % IV SOLN
INTRAVENOUS | Status: DC
  Administered 2012-10-23 – 2012-10-24 (×4): via INTRAVENOUS
  Filled 2012-10-23 (×11): qty 1000

## 2012-10-23 MED ORDER — HYDROCODONE-ACETAMINOPHEN 7.5-325 MG PO TABS
1.0000 | ORAL_TABLET | ORAL | Status: DC
  Administered 2012-10-23 (×2): 1 via ORAL
  Administered 2012-10-24 – 2012-10-25 (×6): 2 via ORAL
  Administered 2012-10-25: 1 via ORAL
  Administered 2012-10-25 (×2): 2 via ORAL
  Administered 2012-10-26 (×2): 1 via ORAL
  Filled 2012-10-23 (×4): qty 2
  Filled 2012-10-23 (×4): qty 1
  Filled 2012-10-23 (×6): qty 2

## 2012-10-23 MED ORDER — FENTANYL CITRATE 0.05 MG/ML IJ SOLN
INTRAMUSCULAR | Status: DC | PRN
  Administered 2012-10-23 (×5): 50 ug via INTRAVENOUS

## 2012-10-23 MED ORDER — POLYETHYLENE GLYCOL 3350 17 G PO PACK
17.0000 g | PACK | Freq: Every day | ORAL | Status: DC | PRN
  Administered 2012-10-26: 09:00:00 17 g via ORAL

## 2012-10-23 MED ORDER — METHOCARBAMOL 500 MG PO TABS
500.0000 mg | ORAL_TABLET | Freq: Four times a day (QID) | ORAL | Status: DC | PRN
  Administered 2012-10-24 (×2): 500 mg via ORAL
  Filled 2012-10-23 (×2): qty 1

## 2012-10-23 MED ORDER — CLINDAMYCIN PHOSPHATE 600 MG/50ML IV SOLN
600.0000 mg | Freq: Four times a day (QID) | INTRAVENOUS | Status: AC
  Administered 2012-10-23 (×2): 600 mg via INTRAVENOUS
  Filled 2012-10-23 (×2): qty 50

## 2012-10-23 MED ORDER — LACTATED RINGERS IV SOLN
INTRAVENOUS | Status: DC | PRN
  Administered 2012-10-23 (×2): via INTRAVENOUS

## 2012-10-23 MED ORDER — DEXAMETHASONE SODIUM PHOSPHATE 10 MG/ML IJ SOLN
10.0000 mg | Freq: Once | INTRAMUSCULAR | Status: AC
  Administered 2012-10-24: 08:00:00 10 mg via INTRAVENOUS
  Filled 2012-10-23: qty 1

## 2012-10-23 MED ORDER — ALUMINUM HYDROXIDE GEL 320 MG/5ML PO SUSP
15.0000 mL | ORAL | Status: DC | PRN
  Filled 2012-10-23: qty 30

## 2012-10-23 MED ORDER — PANTOPRAZOLE SODIUM 40 MG PO TBEC
40.0000 mg | DELAYED_RELEASE_TABLET | Freq: Every day | ORAL | Status: DC
  Administered 2012-10-24 – 2012-10-26 (×3): 40 mg via ORAL
  Filled 2012-10-23 (×4): qty 1

## 2012-10-23 MED ORDER — SENNA 8.6 MG PO TABS
1.0000 | ORAL_TABLET | Freq: Two times a day (BID) | ORAL | Status: DC
  Administered 2012-10-23 – 2012-10-24 (×2): 8.6 mg via ORAL
  Filled 2012-10-23 (×2): qty 1

## 2012-10-23 SURGICAL SUPPLY — 40 items
ADH SKN CLS APL DERMABOND .7 (GAUZE/BANDAGES/DRESSINGS) ×1
BAG SPEC THK2 15X12 ZIP CLS (MISCELLANEOUS) ×2
BAG ZIPLOCK 12X15 (MISCELLANEOUS) ×4 IMPLANT
BLADE SAW SGTL 18X1.27X75 (BLADE) ×2 IMPLANT
CAPT HIP PF MOP ×1 IMPLANT
CLOTH BEACON ORANGE TIMEOUT ST (SAFETY) ×1 IMPLANT
DERMABOND ADVANCED (GAUZE/BANDAGES/DRESSINGS) ×1
DERMABOND ADVANCED .7 DNX12 (GAUZE/BANDAGES/DRESSINGS) ×1 IMPLANT
DRAPE C-ARM 42X120 X-RAY (DRAPES) ×2 IMPLANT
DRAPE STERI IOBAN 125X83 (DRAPES) ×2 IMPLANT
DRAPE U-SHAPE 47X51 STRL (DRAPES) ×6 IMPLANT
DRSG AQUACEL AG ADV 3.5X10 (GAUZE/BANDAGES/DRESSINGS) ×2 IMPLANT
DRSG TEGADERM 4X4.75 (GAUZE/BANDAGES/DRESSINGS) ×1 IMPLANT
DURAPREP 26ML APPLICATOR (WOUND CARE) ×2 IMPLANT
ELECT BLADE TIP CTD 4 INCH (ELECTRODE) ×2 IMPLANT
ELECT REM PT RETURN 9FT ADLT (ELECTROSURGICAL) ×2
ELECTRODE REM PT RTRN 9FT ADLT (ELECTROSURGICAL) ×1 IMPLANT
EVACUATOR 1/8 PVC DRAIN (DRAIN) ×1 IMPLANT
FACESHIELD LNG OPTICON STERILE (SAFETY) ×8 IMPLANT
GAUZE SPONGE 2X2 8PLY STRL LF (GAUZE/BANDAGES/DRESSINGS) ×1 IMPLANT
GLOVE BIOGEL PI IND STRL 7.5 (GLOVE) ×1 IMPLANT
GLOVE BIOGEL PI IND STRL 8 (GLOVE) ×1 IMPLANT
GLOVE BIOGEL PI INDICATOR 7.5 (GLOVE) ×1
GLOVE BIOGEL PI INDICATOR 8 (GLOVE) ×1
GLOVE ECLIPSE 8.0 STRL XLNG CF (GLOVE) ×2 IMPLANT
GLOVE ORTHO TXT STRL SZ7.5 (GLOVE) ×4 IMPLANT
GOWN BRE IMP PREV XXLGXLNG (GOWN DISPOSABLE) ×2 IMPLANT
GOWN PREVENTION PLUS LG XLONG (DISPOSABLE) ×2 IMPLANT
KIT BASIN OR (CUSTOM PROCEDURE TRAY) ×2 IMPLANT
PACK TOTAL JOINT (CUSTOM PROCEDURE TRAY) ×2 IMPLANT
PADDING CAST COTTON 6X4 STRL (CAST SUPPLIES) ×2 IMPLANT
SPONGE GAUZE 2X2 STER 10/PKG (GAUZE/BANDAGES/DRESSINGS) ×1
SUCTION FRAZIER 12FR DISP (SUCTIONS) ×2 IMPLANT
SUT MNCRL AB 4-0 PS2 18 (SUTURE) ×2 IMPLANT
SUT VIC AB 1 CT1 36 (SUTURE) ×8 IMPLANT
SUT VIC AB 2-0 CT1 27 (SUTURE) ×4
SUT VIC AB 2-0 CT1 TAPERPNT 27 (SUTURE) ×2 IMPLANT
SUT VLOC 180 0 24IN GS25 (SUTURE) ×1 IMPLANT
TOWEL OR 17X26 10 PK STRL BLUE (TOWEL DISPOSABLE) ×4 IMPLANT
TRAY FOLEY CATH 14FRSI W/METER (CATHETERS) ×2 IMPLANT

## 2012-10-23 NOTE — Transfer of Care (Signed)
Immediate Anesthesia Transfer of Care Note  Patient: Meghan Sexton  Procedure(s) Performed: Procedure(s): LEFT TOTAL HIP ARTHROPLASTY ANTERIOR APPROACH (Left)  Patient Location: PACU  Anesthesia Type:General  Level of Consciousness: awake, alert , oriented and patient cooperative  Airway & Oxygen Therapy: Patient Spontanous Breathing and Patient connected to face mask oxygen  Post-op Assessment: Report given to PACU RN and Post -op Vital signs reviewed and stable  Post vital signs: Reviewed and stable  Complications: No apparent anesthesia complications

## 2012-10-23 NOTE — Progress Notes (Signed)
Utilization review completed.  

## 2012-10-23 NOTE — Interval H&P Note (Signed)
History and Physical Interval Note:  10/23/2012 6:39 AM  Meghan Sexton  has presented today for surgery, with the diagnosis of LEFT HIP OA   The various methods of treatment have been discussed with the patient and family. After consideration of risks, benefits and other options for treatment, the patient has consented to  Procedure(s): LEFT TOTAL HIP ARTHROPLASTY ANTERIOR APPROACH (Left) as a surgical intervention .  The patient's history has been reviewed, patient examined, no change in status, stable for surgery.  I have reviewed the patient's chart and labs.  Questions were answered to the patient's satisfaction.     [name]

## 2012-10-23 NOTE — Progress Notes (Signed)
PT NOTE  Attempted PT eval POD 0-pt not medically ready for PT eval per RN. Will check back tomorrow to perform eval. Thanks. Rebeca Alert, PT [phone]

## 2012-10-23 NOTE — Preoperative (Signed)
Beta Blockers   Reason not to administer Beta Blockers:Not Applicable 

## 2012-10-23 NOTE — Op Note (Signed)
NAME:  Meghan DEZEEUW                ACCOUNT NO.: [id number]      MEDICAL RECORD NO.: [id number]      FACILITY:  [facility]      PHYSICIAN:  Durene Romans D  DATE OF BIRTH:  1923/12/02     DATE OF PROCEDURE:  10/23/2012                                 OPERATIVE REPORT         PREOPERATIVE DIAGNOSIS: Left  hip osteoarthritis.      POSTOPERATIVE DIAGNOSIS:  Left hip osteoarthritis.      PROCEDURE:  Left total hip replacement through an anterior approach   utilizing DePuy THR system, component size 48mm pinnacle cup, a size 32+4 neutral   Altrex liner, a size 3 Standard Tri Lock stem with a 32+1 Articuleze metal ball.      SURGEON:  Madlyn Frankel. Charlann Boxer, M.D.      ASSISTANT:  Leilani Able, PA-C      ANESTHESIA:  General.      SPECIMENS:  None.      COMPLICATIONS:  None.      BLOOD LOSS:  400 cc     DRAINS:  One Hemovac.      INDICATION OF THE PROCEDURE:  Meghan Sexton is a 79 y.o. female who had   presented to office for evaluation of left hip pain.  Radiographs revealed   progressive degenerative changes with bone-on-bone   articulation to the  hip joint.  The patient had painful limited range of   motion significantly affecting their overall quality of life.  The patient was failing to    respond to conservative measures, and at this point was ready   to proceed with more definitive measures.  The patient has noted progressive   degenerative changes in his hip, progressive problems and dysfunction   with regarding the hip prior to surgery.  Consent was obtained for   benefit of pain relief.  Specific risk of infection, DVT, component   failure, dislocation, need for revision surgery, as well discussion of   the anterior versus posterior approach were reviewed.  Consent was   obtained for benefit of anterior pain relief through an anterior   approach.      PROCEDURE IN DETAIL:  The patient was brought to operative theater.   Once adequate  anesthesia, preoperative antibiotics, 900mg  of Cleocin administered.   The patient was positioned supine on the OSI Hanna table.  Once adequate   padding of boney process was carried out, we had predraped out the hip, and  used fluoroscopy to confirm orientation of the pelvis and position.      The left hip was then prepped and draped from proximal iliac crest to   mid thigh with shower curtain technique.      Time-out was performed identifying the patient, planned procedure, and   extremity.     An incision was then made 2 cm distal and lateral to the   anterior superior iliac spine extending over the orientation of the   tensor fascia lata muscle and sharp dissection was carried down to the   fascia of the muscle and protractor placed in the soft tissues.      The fascia was then incised.  The muscle belly was identified and swept  laterally and retractor placed along the superior neck.  Following   cauterization of the circumflex vessels and removing some pericapsular   fat, a second cobra retractor was placed on the inferior neck.  A third   retractor was placed on the anterior acetabulum after elevating the   anterior rectus.  A L-capsulotomy was along the line of the   superior neck to the trochanteric fossa, then extended proximally and   distally.  Tag sutures were placed and the retractors were then placed   intracapsular.  We then identified the trochanteric fossa and   orientation of my neck cut, confirmed this radiographically   and then made a neck osteotomy with the femur on traction.  The femoral   head was removed without difficulty or complication.  Traction was let   off and retractors were placed posterior and anterior around the   acetabulum.      The labrum and foveal tissue were debrided.  I began reaming with a 45mm   reamer and reamed up to 47mm reamer with good bony bed preparation and a 48   cup was chosen.  The final 48mm Pinnacle cup was then impacted  under fluoroscopy  to confirm the depth of penetration and orientation with respect to   abduction.  A screw was placed followed by the hole eliminator.  The final   32+4 neutral Altrex liner was impacted with good visualized rim fit.  The cup was positioned anatomically within the acetabular portion of the pelvis.      At this point, the femur was rolled at 80 degrees.  Further capsule was   released off the inferior aspect of the femoral neck.  I then   released the superior capsule proximally.  The hook was placed laterally   along the femur and elevated manually and held in position with the bed   hook.  The leg was then extended and adducted with the leg rolled to 100   degrees of external rotation.  Once the proximal femur was fully   exposed, I used a box osteotome to set orientation.  I then began   broaching with the starting chili pepper broach and passed this by hand and then broached up to 3.  With the 3 broach in place I chose a standard neck and did a trial reduction.  The offset was appropriate, leg lengths   appeared to be equal, confirmed radiographically.   Given these findings, I went ahead and dislocated the hip, repositioned all   retractors and positioned the right hip in the extended and abducted position.  The final 3 standard Tri Lock stem was   chosen and it was impacted down to the level of neck cut.  Based on this   and the trial reduction, a 32+1 Articuleze metal ball was chosen and   impacted onto a clean and dry trunnion, and the hip was reduced.  The   hip had been irrigated throughout the case again at this point.  I did   reapproximate the superior capsular leaflet to the anterior leaflet   using #1 Vicryl, placed a medium Hemovac drain deep.  The fascia of the   tensor fascia lata muscle was then reapproximated using #1 Vicryl.  The   remaining wound was closed with 2-0 Vicryl and running 4-0 Monocryl.   The hip was cleaned, dried, and dressed sterilely  using Dermabond and   Aquacel dressing.  Drain site dressed separately.  She was then  brought   to recovery room in stable condition tolerating the procedure well.    Leilani Able, PA-C was present for the entirety of the case involved from   preoperative positioning, perioperative retractor management, general   facilitation of the case, as well as primary wound closure as assistant.            Madlyn Frankel Charlann Boxer, M.D.            MDO/MEDQ  D:  11/02/2010  T:  11/02/2010  Job:  [id number]      Electronically Signed by Durene Romans M.D. on 11/08/2010 09:15:38 AM

## 2012-10-23 NOTE — Anesthesia Postprocedure Evaluation (Signed)
  Anesthesia Post-op Note  Patient: Meghan Sexton  Procedure(s) Performed: Procedure(s) (LRB): LEFT TOTAL HIP ARTHROPLASTY ANTERIOR APPROACH (Left)  Patient Location: PACU  Anesthesia Type: General  Level of Consciousness: awake and alert   Airway and Oxygen Therapy: Patient Spontanous Breathing  Post-op Pain: mild  Post-op Assessment: Post-op Vital signs reviewed, Patient's Cardiovascular Status Stable, Respiratory Function Stable, Patent Airway and No signs of Nausea or vomiting  Last Vitals:  Filed Vitals:   10/23/12 0945  BP:   Pulse: 59  Temp:   Resp: 10    Post-op Vital Signs: stable   Complications: No apparent anesthesia complications

## 2012-10-24 ENCOUNTER — Encounter (HOSPITAL_COMMUNITY): Payer: Self-pay | Admitting: Orthopedic Surgery

## 2012-10-24 LAB — CBC
MCH: 31.3 pg (ref 26.0–34.0)
MCHC: 33.9 g/dL (ref 30.0–36.0)
MCV: 92.5 fL (ref 78.0–100.0)
Platelets: 198 10*3/uL (ref 150–400)
RBC: 2.68 MIL/uL — ABNORMAL LOW (ref 3.87–5.11)
RDW: 13.7 % (ref 11.5–15.5)
WBC: 9.9 10*3/uL (ref 4.0–10.5)

## 2012-10-24 LAB — BASIC METABOLIC PANEL
BUN: 10 mg/dL (ref 6–23)
Calcium: 9.2 mg/dL (ref 8.4–10.5)
Chloride: 99 mEq/L (ref 96–112)
Creatinine, Ser: 0.57 mg/dL (ref 0.50–1.10)
GFR calc Af Amer: >90 mL/min (ref >90)
GFR calc non Af Amer: 81 mL/min — ABNORMAL LOW (ref >90)
Sodium: 133 mEq/L — ABNORMAL LOW (ref 135–145)

## 2012-10-24 MED ORDER — SENNA 8.6 MG PO TABS: 1.0000 | ORAL_TABLET | Freq: Every day | ORAL | Status: DC | PRN

## 2012-10-24 MED ORDER — CALCIUM CARBONATE ANTACID 500 MG PO CHEW
1.0000 | CHEWABLE_TABLET | Freq: Three times a day (TID) | ORAL | Status: DC | PRN
  Administered 2012-10-24: 14:00:00 200 mg via ORAL
  Filled 2012-10-24: qty 1

## 2012-10-24 MED ORDER — POLYETHYLENE GLYCOL 3350 17 G PO PACK
17.0000 g | PACK | Freq: Two times a day (BID) | ORAL | Status: DC
  Administered 2012-10-24 – 2012-10-26 (×3): 17 g via ORAL

## 2012-10-24 MED ORDER — ALUM & MAG HYDROXIDE-SIMETH 200-200-20 MG/5ML PO SUSP
15.0000 mL | Freq: Four times a day (QID) | ORAL | Status: DC | PRN
  Administered 2012-10-25 – 2012-10-26 (×2): 15 mL via ORAL
  Filled 2012-10-24 (×2): qty 30

## 2012-10-24 NOTE — Care Management Note (Signed)
    Page 1 of 1   10/24/2012     4:01:37 PM   CARE MANAGEMENT NOTE 10/24/2012  Patient:  Meghan Sexton, Meghan Sexton   Account Number:  [id number]  Date Initiated:  10/24/2012  Documentation initiated by:  Colleen Can  Subjective/Objective Assessment:   dx left hip osteoarthritis; left hip replacemnt, anterior approach     Action/Plan:   Recommendations for SNF rehab. CSW referral. CM will follow up if needed.   Anticipated DC Date:  10/26/2012   Anticipated DC Plan:  SKILLED NURSING FACILITY  In-house referral  Clinical Social Worker      DC Planning Services  CM consult      Choice offered to / List presented to:             Status of service:  Completed, signed off Medicare Important Message given?  NA - LOS <3 / Initial given by admissions (If response is "NO", the following Medicare IM given date fields will be blank) Date Medicare IM given:   Date Additional Medicare IM given:    Discharge Disposition:    Per UR Regulation:    If discussed at Long Length of Stay Meetings, dates discussed:    Comments:

## 2012-10-24 NOTE — Clinical Social Work Psychosocial (Signed)
     Clinical Social Work Department BRIEF PSYCHOSOCIAL ASSESSMENT 10/24/2012  Patient:  Meghan Sexton, Meghan Sexton     Account Number:  [id number]     Admit date:  10/23/2012  Clinical Social Worker:  Hattie Perch  Date/Time:  10/24/2012 12:00 M  Referred by:  Physician  Date Referred:  10/24/2012 Referred for  SNF Placement   Other Referral:   Interview type:  Patient Other interview type:   daughter at bedside    PSYCHOSOCIAL DATA Living Status:  ALONE Admitted from facility:   Level of care:   Primary support name:  Fish farm manager Primary support relationship to patient:  CHILD, ADULT Degree of support available:   good    CURRENT CONCERNS Current Concerns  Post-Acute Placement   Other Concerns:    SOCIAL WORK ASSESSMENT / PLAN CSW met with patient. patient is alert and oriented X3. patient in need of snf placement. patient is preregistered at [facility] but only wants to go there if they have a private room available. she states that she wont be able to sleep if she has to share a room.   Assessment/plan status:   Other assessment/ plan:   Information/referral to community resources:    PATIENTS/FAMILYS RESPONSE TO PLAN OF CARE: patient is hopeful for quick recovery after short term snf. wants a private room at [facility].

## 2012-10-24 NOTE — Evaluation (Signed)
Occupational Therapy Evaluation Patient Details Name: Meghan Sexton MRN: [id number] DOB: [birth year] Today's Date: 10/24/2012 Time: 1478-2956 OT Time Calculation (min): 27 min  OT Assessment / Plan / Recommendation History of present illness 79 yo female s/p L THA-DA 10/14   Clinical Impression   Pt demos decline in function with ADLs and ADL mobility safety and would benefit from acute OT services to address impairments to increase level of function and safety. Pt lives at home alone and is planning to d/c to SNF for rehab before returning home    OT Assessment  Patient needs continued OT Services    Follow Up Recommendations  SNF;Supervision/Assistance - 24 hour    Barriers to Discharge Decreased caregiver support Pt lives at home alone, planning to d/c to SNF for rehab  Equipment Recommendations  None recommended by OT;Other (comment) (TBD)    Recommendations for Other Services    Frequency  Min 2X/week    Precautions / Restrictions Precautions Precautions: Fall Restrictions Weight Bearing Restrictions: No LLE Weight Bearing: Weight bearing as tolerated   Pertinent Vitals/Pain 5/10    ADL  Grooming: Performed;Set up Where Assessed - Grooming: Unsupported sitting Upper Body Bathing: Simulated;Supervision/safety;Set up Lower Body Bathing: Maximal assistance Upper Body Dressing: Performed;Supervision/safety;Set up Lower Body Dressing: Maximal assistance Toilet Transfer: Simulated;Minimal assistance Toilet Transfer Method: Sit to stand Toileting - Clothing Manipulation and Hygiene: Moderate assistance Where Assessed - Toileting Clothing Manipulation and Hygiene: Standing Tub/Shower Transfer Method: Not assessed Equipment Used: Gait belt;Rolling walker Transfers/Ambulation Related to ADLs: VCs safety, technique, hand placement. Assist to rise, stabilize, control descent ADL Comments: Pt familar with ADL A/E from previous back surgery    OT Diagnosis: Generalized  weakness;Acute pain  OT Problem List: Decreased strength;Decreased activity tolerance;Impaired balance (sitting and/or standing);Pain OT Treatment Interventions: Self-care/ADL training;Therapeutic exercise;Patient/family education;Neuromuscular education;Balance training;Therapeutic activities;DME and/or AE instruction   OT Goals(Current goals can be found in the care plan section) Acute Rehab OT Goals Patient Stated Goal: regain independence OT Goal Formulation: With patient Time For Goal Achievement: 10/31/12 Potential to Achieve Goals: Good ADL Goals Pt Will Perform Grooming: standing;with min assist;with min guard assist Pt Will Perform Lower Body Bathing: with mod assist Pt Will Perform Lower Body Dressing: with mod assist Pt Will Transfer to Toilet: with min guard assist;with supervision Pt Will Perform Toileting - Clothing Manipulation and hygiene: with min assist;sit to/from stand  Visit Information  Last OT Received On: 10/24/12 Assistance Needed: +1 History of Present Illness: 78 yo female s/p L THA-DA 10/14       Prior Functioning     Home Living Family/patient expects to be discharged to:: Skilled nursing facility Living Arrangements: Alone Additional Comments: Plan to d/c to SNF for rehab Prior Function Level of Independence: Independent Communication Communication: HOH Dominant Hand: Right         Vision/Perception Vision - History Baseline Vision: Wears glasses only for reading Patient Visual Report: No change from baseline Perception Perception: Within Functional Limits   Cognition  Cognition Arousal/Alertness: Awake/alert Behavior During Therapy: WFL for tasks assessed/performed Overall Cognitive Status: Within Functional Limits for tasks assessed    Extremity/Trunk Assessment Upper Extremity Assessment Upper Extremity Assessment: Overall WFL for tasks assessed;Generalized weakness Lower Extremity Assessment Lower Extremity Assessment: Defer  to PT evaluation Cervical / Trunk Assessment Cervical / Trunk Assessment: Normal     Mobility Bed Mobility Bed Mobility: Supine to Sit;Sitting - Scoot to Edge of Bed Supine to Sit: 3: Mod assist Sitting - Scoot to Edge of Bed:  4: Min assist Details for Bed Mobility Assistance: pt sitting in recliner Transfers Transfers: Sit to Stand;Stand to Sit Sit to Stand: 4: Min assist;With armrests;From bed Stand to Sit: To chair/3-in-1;4: Min assist;With armrests Details for Transfer Assistance: VCs safety, technique, hand placement. Assist to rise, stabilize, control descent     Exercise     Balance Balance Balance Assessed: No   End of Session OT - End of Session Equipment Utilized During Treatment: Gait belt;Rolling walker Activity Tolerance: Patient tolerated treatment well Patient left: in chair;with call bell/phone within reach;with nursing/sitter in room  GO     Galen Manila 10/24/2012, 12:46 PM

## 2012-10-24 NOTE — Evaluation (Signed)
Physical Therapy Evaluation Patient Details Name: Meghan Sexton MRN: [id number] DOB: Nov 30, 1923 Today's Date: 10/24/2012 Time: 4098-1191 PT Time Calculation (min): 11 min  PT Assessment / Plan / Recommendation History of Present Illness  79 yo female s/p L THA-DA 10/14  Clinical Impression  On eval, pt required Min assist for mobility-able to ambulate ~25 feet with RW. Pt lives alone. Recommend ST rehab at SNF to improve strength, activity tolerance, gait and balance.     PT Assessment  Patient needs continued PT services    Follow Up Recommendations  SNF    Does the patient have the potential to tolerate intense rehabilitation      Barriers to Discharge        Equipment Recommendations  None recommended by PT    Recommendations for Other Services OT consult   Frequency 7X/week    Precautions / Restrictions Precautions Precautions: Fall Restrictions Weight Bearing Restrictions: No LLE Weight Bearing: Weight bearing as tolerated   Pertinent Vitals/Pain L hip 5/10 with activity. Ice applied end of session      Mobility  Bed Mobility Bed Mobility: Not assessed Details for Bed Mobility Assistance: pt sitting in recliner Transfers Transfers: Sit to Stand;Stand to Sit Sit to Stand: From chair/3-in-1;4: Min assist;With armrests Stand to Sit: To chair/3-in-1;4: Min assist;With armrests Details for Transfer Assistance: VCs safety, technique, hand placement. Assist to rise, stabilize, control descent Ambulation/Gait Ambulation/Gait Assistance: 4: Min assist Ambulation Distance (Feet): 25 Feet Assistive device: Rolling walker Ambulation/Gait Assistance Details: VCs safety, technique, sequence, distance from RW. Assist to stabilize throughout ambulation Gait Pattern: Step-to pattern;Decreased stride length;Antalgic;Decreased step length - left    Exercises     PT Diagnosis: Difficulty walking;Acute pain;Abnormality of gait;Generalized weakness  PT Problem List:  Decreased strength;Decreased range of motion;Decreased activity tolerance;Decreased mobility;Decreased balance;Pain;Decreased knowledge of precautions;Decreased knowledge of use of DME PT Treatment Interventions: DME instruction;Gait training;Functional mobility training;Therapeutic exercise;Therapeutic activities;Patient/family education     PT Goals(Current goals can be found in the care plan section) Acute Rehab PT Goals Patient Stated Goal: regain independence PT Goal Formulation: With patient Time For Goal Achievement: 10/31/12 Potential to Achieve Goals: Good  Visit Information  Last PT Received On: 10/24/12 Assistance Needed: +1 History of Present Illness: 79 yo female s/p L THA-DA 10/14       Prior Functioning  Home Living Family/patient expects to be discharged to:: Skilled nursing facility Living Arrangements: Alone Additional Comments: Plan to d/c to SNF for rehab Prior Function Level of Independence: Independent Communication Communication: HOH Dominant Hand: Right    Cognition  Cognition Arousal/Alertness: Awake/alert Behavior During Therapy: WFL for tasks assessed/performed Overall Cognitive Status: Within Functional Limits for tasks assessed    Extremity/Trunk Assessment Upper Extremity Assessment Upper Extremity Assessment: Generalized weakness Lower Extremity Assessment Lower Extremity Assessment: Generalized weakness;LLE deficits/detail LLE: Unable to fully assess due to pain Cervical / Trunk Assessment Cervical / Trunk Assessment: Normal   Balance    End of Session PT - End of Session Activity Tolerance: Patient limited by pain;Patient limited by fatigue Patient left: in chair;with call bell/phone within reach  GP     Rebeca Alert, MPT Pager: [phone]

## 2012-10-24 NOTE — Progress Notes (Signed)
Physical Therapy Treatment Patient Details Name: Meghan Sexton MRN: [id number] DOB: 09-07-1923 Today's Date: 10/24/2012 Time: 2841-3244 PT Time Calculation (min): 17 min  PT Assessment / Plan / Recommendation  History of Present Illness 79 yo female s/p L THA-DA 10/14   PT Comments   Progressing slowly with mobility. Recommend SNF for rehab  Follow Up Recommendations  SNF     Does the patient have the potential to tolerate intense rehabilitation     Barriers to Discharge        Equipment Recommendations  None recommended by PT    Recommendations for Other Services    Frequency 7X/week   Progress towards PT Goals Progress towards PT goals: Progressing toward goals  Plan Current plan remains appropriate    Precautions / Restrictions Precautions Precautions: Fall Restrictions Weight Bearing Restrictions: No LLE Weight Bearing: Weight bearing as tolerated   Pertinent Vitals/Pain L hip 5/10 with activity. Repositioned in bed    Mobility  Bed Mobility Bed Mobility: Sit to Supine Sitting - Scoot to Edge of Bed: 4: Min assist Sit to Supine: 4: Min assist Details for Bed Mobility Assistance: Assist for L LE onto bed.  Transfers Transfers: Sit to Stand;Stand to Sit Sit to Stand: 4: Min assist;From chair/3-in-1;With armrests Stand to Sit: 4: Min assist;To bed Details for Transfer Assistance: VCs safety, technique, hand placement. Assist to rise, stabilize, control descent Ambulation/Gait Ambulation/Gait Assistance: 4: Min assist Ambulation Distance (Feet): 40 Feet Assistive device: Rolling walker Ambulation/Gait Assistance Details: Assist to stabilize throughout ambulation. fatigues easily. slow gait speed. vcs safety, sequencing Gait Pattern: Step-to pattern;Decreased stride length;Antalgic;Decreased step length - left    Exercises Total Joint Exercises Ankle Circles/Pumps: AROM;Both;15 reps;Supine Quad Sets: AROM;Both;10 reps;Supine Heel Slides: AAROM;Left;10  reps;Supine Hip ABduction/ADduction: AAROM;Left;10 reps;Supine   PT Diagnosis:    PT Problem List:   PT Treatment Interventions:     PT Goals (current goals can now be found in the care plan section) Acute Rehab PT Goals Patient Stated Goal: regain independence  Visit Information  Last PT Received On: 10/24/12 Assistance Needed: +1 History of Present Illness: 79 yo female s/p L THA-DA 10/14    Subjective Data  Patient Stated Goal: regain independence   Cognition  Cognition Arousal/Alertness: Awake/alert Behavior During Therapy: WFL for tasks assessed/performed Overall Cognitive Status: Within Functional Limits for tasks assessed    Balance  Balance Balance Assessed: No  End of Session PT - End of Session Equipment Utilized During Treatment: Gait belt Activity Tolerance: Patient limited by pain Patient left: in bed;with call bell/phone within reach   GP     Rebeca Alert, MPT Pager: [phone]

## 2012-10-24 NOTE — Progress Notes (Signed)
   Subjective: 1 Day Post-Op Procedure(s) (LRB): LEFT TOTAL HIP ARTHROPLASTY ANTERIOR APPROACH (Left)   Patient reports pain as mild, pain well controlled. No events throughout the night.   Objective:   VITALS:   Filed Vitals:   10/24/12  BP: 108/59  Pulse: 66  Temp: 97.9 F (36.6 C)   Resp: 17    Neurovascular intact Dorsiflexion/Plantar flexion intact Incision: dressing C/D/I No cellulitis present Compartment soft  LABS  Recent Labs  10/24/12 0500  HGB 8.4*  HCT 24.8*  WBC 9.9  PLT 198     Recent Labs  10/24/12 0500  NA 133*  K 4.5  BUN 10  CREATININE 0.57  GLUCOSE 123*     Assessment/Plan: 1 Day Post-Op Procedure(s) (LRB): LEFT TOTAL HIP ARTHROPLASTY ANTERIOR APPROACH (Left) HV drain d/c'ed Foley cath d/c'ed Advance diet Up with therapy D/C IV fluids Discharge to SNF [facility]) eventually, when ready  Expected ABLA  Treated with iron and will observe  Overweight (BMI 25-29.9) Estimated body mass index is 27.8 kg/(m^2) as calculated from the following:   Height as of this encounter: 4\' 10"  (1.473 m).   Weight as of this encounter: 60.328 kg (133 lb). Patient also counseled that weight may inhibit the healing process Patient counseled that losing weight will help with future health issues        Anastasio Auerbach. Aletheia Tangredi   PAC  10/24/2012, 9:13 AM

## 2012-10-25 LAB — BASIC METABOLIC PANEL
CO2: 28 mEq/L (ref 19–32)
Calcium: 9.3 mg/dL (ref 8.4–10.5)
Chloride: 100 mEq/L (ref 96–112)
Creatinine, Ser: 0.58 mg/dL (ref 0.50–1.10)
GFR calc Af Amer: >90 mL/min (ref >90)
Sodium: 132 mEq/L — ABNORMAL LOW (ref 135–145)

## 2012-10-25 LAB — CBC
MCHC: 33.6 g/dL (ref 30.0–36.0)
MCV: 93.6 fL (ref 78.0–100.0)
Platelets: 168 10*3/uL (ref 150–400)
RBC: 2.51 MIL/uL — ABNORMAL LOW (ref 3.87–5.11)
RDW: 13.6 % (ref 11.5–15.5)
WBC: 8.7 10*3/uL (ref 4.0–10.5)

## 2012-10-25 LAB — HEMOGLOBIN AND HEMATOCRIT, BLOOD
HCT: 26.8 % — ABNORMAL LOW (ref 36.0–46.0)
Hemoglobin: 9 g/dL — ABNORMAL LOW (ref 12.0–15.0)

## 2012-10-25 MED ORDER — ASPIRIN 325 MG PO TBEC: 325.0000 mg | DELAYED_RELEASE_TABLET | Freq: Two times a day (BID) | ORAL | Status: AC

## 2012-10-25 MED ORDER — ALPRAZOLAM 1 MG PO TABS
1.0000 mg | ORAL_TABLET | Freq: Once | ORAL | Status: AC
  Administered 2012-10-25: 1 mg via ORAL
  Filled 2012-10-25: qty 1

## 2012-10-25 MED ORDER — POLYETHYLENE GLYCOL 3350 17 G PO PACK: 17.0000 g | PACK | Freq: Two times a day (BID) | ORAL | Status: DC

## 2012-10-25 MED ORDER — DSS 100 MG PO CAPS: 100.0000 mg | ORAL_CAPSULE | Freq: Two times a day (BID) | ORAL | Status: DC

## 2012-10-25 MED ORDER — METHOCARBAMOL 500 MG PO TABS: 500.0000 mg | ORAL_TABLET | Freq: Four times a day (QID) | ORAL | Status: DC | PRN

## 2012-10-25 MED ORDER — HYDROCODONE-ACETAMINOPHEN 7.5-325 MG PO TABS: 1.0000 | ORAL_TABLET | ORAL | Status: DC

## 2012-10-25 MED ORDER — FERROUS SULFATE 325 (65 FE) MG PO TABS: 325.0000 mg | ORAL_TABLET | Freq: Three times a day (TID) | ORAL | Status: DC

## 2012-10-25 NOTE — Progress Notes (Signed)
   Subjective: 2 Days Post-Op Procedure(s) (LRB): LEFT TOTAL HIP ARTHROPLASTY ANTERIOR APPROACH (Left)   Patient reports pain as mild, pain well controlled. No events throughout the night.   Objective:   VITALS:   Filed Vitals:   10/25/12 0540  BP: 145/68  Pulse: 61  Temp: 98.3 F (36.8 C)  Resp: 16    Neurovascular intact Dorsiflexion/Plantar flexion intact Incision: dressing C/D/I No cellulitis present Compartment soft  LABS  Recent Labs  10/24/12 0500 10/25/12 0515  HGB 8.4* 7.9*  HCT 24.8* 23.5*  WBC 9.9 8.7  PLT 198 168     Recent Labs  10/24/12 0500 10/25/12 0515  NA 133* 132*  K 4.5 4.2  BUN 10 10  CREATININE 0.57 0.58  GLUCOSE 123* 112*     Assessment/Plan: 2 Days Post-Op Procedure(s) (LRB): LEFT TOTAL HIP ARTHROPLASTY ANTERIOR APPROACH (Left) Up with therapy Discharge to SNF [facility]) eventually, when ready  Expected ABLA  Treated with iron and will observe   Overweight (BMI 25-29.9)  Estimated body mass index is 27.8 kg/(m^2) as calculated from the following:      Height as of this encounter: 4\' 10"  (1.473 m).      Weight as of this encounter: 60.328 kg (133 lb).  Patient also counseled that weight may inhibit the healing process  Patient counseled that losing weight will help with future health issues       Anastasio Auerbach. Trinna Kunst   PAC  10/25/2012, 7:39 AM

## 2012-10-25 NOTE — Discharge Summary (Signed)
Physician Discharge Summary  Patient ID: Meghan Sexton MRN: [id number] DOB/AGE: 77/18/1925 77 y.o.  Admit date: 10/23/2012 Discharge date:  10/26/2012    Procedures:  Procedure(s) (LRB): LEFT TOTAL HIP ARTHROPLASTY ANTERIOR APPROACH (Left)  Attending Physician:  Dr. Durene Romans   Admission Diagnoses:   Left hip OA / pain  Discharge Diagnoses:  Principal Problem:   S/P left THA, AA  Past Medical History  Diagnosis Date  . Hypertension   . Asthma   . Hypothyroidism   . Constipation   . Blood transfusion 2010     1 unit after each back surgery  . Sleep apnea     stopbang=4  . Anxiety   . Depression   . GERD (gastroesophageal reflux disease)   . Arthritis     HPI: Meghan Sexton, 79 y.o. female, has a history of pain and functional disability in the left hip(s) due to arthritis and patient has failed non-surgical conservative treatments for greater than 12 weeks to include NSAID's and/or analgesics, corticosteriod injections, use of assistive devices and activity modification. Onset of symptoms was gradual starting years ago with rapidlly worsening course since that time.The patient noted no past surgery on the left hip(s). Patient currently rates pain in the left hip at 8 out of 10 with activity. Patient has night pain, worsening of pain with activity and weight bearing, trendelenberg gait, pain that interfers with activities of daily living, pain with passive range of motion and crepitus. Patient has evidence of periarticular osteophytes and joint space narrowing by imaging studies. This condition presents safety issues increasing the risk of falls. There is no current active infection. Risks, benefits and expectations were discussed with the patient. Patient understand the risks, benefits and expectations and wishes to proceed with surgery.   PCP: Meghan Pounds, MD   Discharged Condition: good  Hospital Course:  Patient underwent the above stated procedure on  10/23/2012. Patient tolerated the procedure well and brought to the recovery room in good condition and subsequently to the floor.  POD #1 BP: 108/59 ; Pulse: 66 ; Temp: 97.9 F (36.6 C) ; Resp: 17  Pt's foley was removed, as well as the hemovac drain removed. IV was changed to a saline lock. Patient reports pain as mild, pain well controlled. No events throughout the night.  Neurovascular intact, dorsiflexion/plantar flexion intact, incision: dressing C/D/I, no cellulitis present and compartment soft.   LABS  Basename    HGB  8.4  HCT  24.8   POD #2  BP: 145/68 ; Pulse: 61 ; Temp: 98.3 F (36.8 C) ; Resp: 16  Patient reports pain as mild, pain well controlled. No events throughout the night.  Neurovascular intact, dorsiflexion/plantar flexion intact, incision: dressing C/D/I, no cellulitis present and compartment soft.   LABS  Basename    HGB  7.9  HCT  23.5   POD #3  BP: 135/66 ; Pulse: 69 ; Temp: 98.2 F (36.8 C) ; Resp: 16  Patient reports pain as mild. Comfortable night with good night sleep with Xanax. Ready to be discharged to SNF. Neurovascular intact, dorsiflexion/plantar flexion intact, incision: dressing C/D/I, no cellulitis present and compartment soft.   LABS   No new labs   Discharge Exam: General appearance: alert, cooperative and no distress Extremities: Homans sign is negative, no sign of DVT, no edema, redness or tenderness in the calves or thighs and no ulcers, gangrene or trophic changes  Disposition: Skilled Nursing Facility with follow up in 2 weeks  Follow-up Information   Follow up with [name], MD. Schedule an appointment as soon as possible for a visit in 2 weeks.   Specialty:  Orthopedic Surgery   Contact information:   [address] [place] [address] [phone]       Discharge Orders   Future Orders Complete By Expires   Call MD / Call 911  As directed    Comments:     If you experience chest pain or  shortness of breath, CALL 911 and be transported to the hospital emergency room.  If you develope a fever above 101 F, pus (white drainage) or increased drainage or redness at the wound, or calf pain, call your surgeon's office.   Change dressing  As directed    Comments:     Maintain surgical dressing for 10-14 days, then replace with 4x4 guaze and tape. Keep the area dry and clean.   Constipation Prevention  As directed    Comments:     Drink plenty of fluids.  Prune juice may be helpful.  You may use a stool softener, such as Colace (over the counter) 100 mg twice a day.  Use MiraLax (over the counter) for constipation as needed.   Diet - low sodium heart healthy  As directed    Discharge instructions  As directed    Comments:     Maintain surgical dressing for 10-14 days, then replace with gauze and tape. Keep the area dry and clean until follow up. Follow up in 2 weeks at [facility]. Call with any questions or concerns.   Driving restrictions  As directed    Comments:     No driving for 4 weeks   Increase activity slowly as tolerated  As directed    TED hose  As directed    Comments:     Use stockings (TED hose) for 2 weeks on both leg(s).  You may remove them at night for sleeping.   Weight bearing as tolerated  As directed    Questions:     Laterality:     Extremity:          Medication List    STOP taking these medications       HYDROcodone-acetaminophen 10-325 MG per tablet  Commonly known as:  NORCO  Replaced by:  HYDROcodone-acetaminophen 7.5-325 MG per tablet     meloxicam 15 MG tablet  Commonly known as:  MOBIC      TAKE these medications       albuterol 108 (90 BASE) MCG/ACT inhaler  Commonly known as:  PROVENTIL HFA;VENTOLIN HFA  Inhale 1-2 puffs into the lungs every 6 (six) hours as needed for wheezing (Use q 6 hours for next 3 days then prn).     aspirin 325 MG EC tablet  Take 1 tablet (325 mg total) by mouth 2 (two) times daily.      benazepril-hydrochlorthiazide 20-12.5 MG per tablet  Commonly known as:  LOTENSIN HCT  Take 1 tablet by mouth every morning.     budesonide 180 MCG/ACT inhaler  Commonly known as:  PULMICORT  Inhale 2 puffs into the lungs at bedtime.     CALCIUM & MAGNESIUM CARBONATES PO  Take 1 tablet by mouth every morning.     cycloSPORINE 0.05 % ophthalmic emulsion  Commonly known as:  RESTASIS  Place 1 drop into both eyes every 12 (twelve) hours.     diazepam 2 MG tablet  Commonly known as:  VALIUM  Take 2 mg by mouth every 6 (six) hours as needed. For anxiety     DSS 100 MG Caps  Take 100 mg by mouth 2 (two) times daily.     fentaNYL 50 MCG/HR  Commonly known as:  DURAGESIC - dosed mcg/hr  Place 1 patch onto the skin every 3 (three) days.     ferrous sulfate 325 (65 FE) MG tablet  Take 1 tablet (325 mg total) by mouth 3 (three) times daily after meals.     furosemide 40 MG tablet  Commonly known as:  LASIX  Take 40 mg by mouth daily as needed for edema.     gabapentin 100 MG capsule  Commonly known as:  NEURONTIN  Take 100 mg by mouth 2 (two) times daily.     hydrochlorothiazide 12.5 MG tablet  Commonly known as:  HYDRODIURIL  Take 12.5 mg by mouth every morning.     HYDROcodone-acetaminophen 7.5-325 MG per tablet  Commonly known as:  NORCO  Take 1-2 tablets by mouth every 4 (four) hours.     hyoscyamine 0.125 MG Tbdp tablet  Commonly known as:  ANASPAZ  Place 0.125 mg under the tongue every 4 (four) hours as needed. May take 1 to 2 tablets. For stomach cramps.     ipratropium 0.06 % nasal spray  Commonly known as:  ATROVENT  Place 2 sprays into the nose every morning.     levothyroxine 125 MCG tablet  Commonly known as:  SYNTHROID, LEVOTHROID  Take 125 mcg by mouth every morning.     methocarbamol 500 MG tablet  Commonly known as:  ROBAXIN  Take 1 tablet (500 mg total) by mouth every 6 (six) hours as needed (muscle spasms).     multivitamin with minerals Tabs tablet   Take 1 tablet by mouth every morning.     pantoprazole 40 MG tablet  Commonly known as:  PROTONIX  Take 40 mg by mouth daily.     polyethylene glycol packet  Commonly known as:  MIRALAX / GLYCOLAX  Take 17 g by mouth 2 (two) times daily.     quiNINE 324 MG capsule  Commonly known as:  QUALAQUIN  Take 648 mg by mouth every 8 (eight) hours as needed. For muscle cramps in legs     traZODone 50 MG tablet  Commonly known as:  DESYREL  Take 50 mg by mouth at bedtime.     vitamin C 250 MG tablet  Commonly known as:  ASCORBIC ACID  Take 1,000 mg by mouth every morning.         Signed: Anastasio Auerbach. Georganne Siple   PAC  10/25/2012, 9:11 AM

## 2012-10-25 NOTE — Progress Notes (Signed)
Physical Therapy Treatment Patient Details Name: Meghan Sexton MRN: [id number] DOB: Mar 31, 1923 Today's Date: 10/25/2012 Time: 4098-1191 PT Time Calculation (min): 25 min  PT Assessment / Plan / Recommendation  History of Present Illness 79 yo female s/p L THA-DA 10/14   PT Comments   Progressing. Plan is for SNF tomorrow.   Follow Up Recommendations  SNF     Does the patient have the potential to tolerate intense rehabilitation     Barriers to Discharge        Equipment Recommendations  None recommended by PT    Recommendations for Other Services OT consult  Frequency 7X/week   Progress towards PT Goals Progress towards PT goals: Progressing toward goals  Plan Current plan remains appropriate    Precautions / Restrictions Precautions Precautions: Fall Restrictions Weight Bearing Restrictions: No LLE Weight Bearing: Weight bearing as tolerated   Pertinent Vitals/Pain L LE 5/10 with activity    Mobility  Bed Mobility Bed Mobility: Sit to Supine Sit to Supine: 4: Min assist Details for Bed Mobility Assistance: Assist for trunk and L LE. Increased time.  Transfers Transfers: Sit to Stand;Stand to Sit Sit to Stand: 4: Min assist;From chair/3-in-1 Stand to Sit: 4: Min assist;To chair/3-in-1;To bed Details for Transfer Assistance: x2. VCs safety, technique, hand placement. Assist to rise, stabilize, control descent Ambulation/Gait Ambulation/Gait Assistance: 4: Min assist Ambulation Distance (Feet): 60 Feet Assistive device: Rolling walker Ambulation/Gait Assistance Details: Assist to stabilize Gait Pattern: Step-to pattern;Decreased stride length;Antalgic;Decreased step length - left;Step-through pattern    Exercises     PT Diagnosis:    PT Problem List:   PT Treatment Interventions:     PT Goals (current goals can now be found in the care plan section)    Visit Information  Last PT Received On: 10/25/12 Assistance Needed: +1 History of Present Illness:  79 yo female s/p L THA-DA 10/14    Subjective Data      Cognition       Balance     End of Session PT - End of Session Activity Tolerance: Patient tolerated treatment well Patient left: in bed;with call bell/phone within reach   GP     Rebeca Alert, MPT Pager: [phone]

## 2012-10-25 NOTE — Progress Notes (Signed)
Physical Therapy Treatment Patient Details Name: Meghan Sexton MRN: [id number] DOB: 04/28/1923 Today's Date: 10/25/2012 Time: 0865-7846 PT Time Calculation (min): 44 min  PT Assessment / Plan / Recommendation  History of Present Illness 79 yo female s/p L THA-DA 10/14   PT Comments   Progressing with mobility. Plan is for [facility] on tomorrow.   Follow Up Recommendations  SNF     Does the patient have the potential to tolerate intense rehabilitation     Barriers to Discharge        Equipment Recommendations  None recommended by PT    Recommendations for Other Services OT consult  Frequency 7X/week   Progress towards PT Goals Progress towards PT goals: Progressing toward goals  Plan Current plan remains appropriate    Precautions / Restrictions Precautions Precautions: Fall Restrictions Weight Bearing Restrictions: No LLE Weight Bearing: Weight bearing as tolerated   Pertinent Vitals/Pain L LE 5/10 with activity. Ice applied end of session    Mobility  Bed Mobility Bed Mobility: Supine to Sit Supine to Sit: 3: Mod assist Details for Bed Mobility Assistance: Assist for trunk and L LE. Increased time.  Transfers Transfers: Sit to Stand;Stand to Sit Sit to Stand: 4: Min assist;From bed;From chair/3-in-1 Stand to Sit: 4: Min assist;To chair/3-in-1 Details for Transfer Assistance: x2. VCs safety, technique, hand placement. Assist to rise, stabilize, control descent Ambulation/Gait Ambulation/Gait Assistance: 4: Min assist Ambulation Distance (Feet): 55 Feet Assistive device: Rolling walker Ambulation/Gait Assistance Details: Assist to stabilize throughout ambulation. fatigues easily, Gait Pattern: Step-to pattern;Decreased stride length;Antalgic;Decreased step length - left    Exercises Total Joint Exercises Ankle Circles/Pumps: AROM;Both;10 reps;Supine Quad Sets: AROM;Both;10 reps;Supine Heel Slides: AAROM;Left;10 reps;Supine Hip ABduction/ADduction: AAROM;Left;10  reps;Supine   PT Diagnosis:    PT Problem List:   PT Treatment Interventions:     PT Goals (current goals can now be found in the care plan section)    Visit Information  Last PT Received On: 10/25/12 Assistance Needed: +1 History of Present Illness: 79 yo female s/p L THA-DA 10/14    Subjective Data      Cognition  Cognition Arousal/Alertness: Awake/alert Behavior During Therapy: WFL for tasks assessed/performed Overall Cognitive Status: Within Functional Limits for tasks assessed    Balance     End of Session PT - End of Session Equipment Utilized During Treatment: Gait belt Activity Tolerance: Patient tolerated treatment well Patient left: in chair;with call bell/phone within reach   GP     Rebeca Alert, MPT Pager: [phone]

## 2012-10-26 NOTE — Progress Notes (Signed)
Patient ID: Meghan Sexton, female   DOB: 1923/05/04, 79 y.o.   MRN: [id number] Subjective: 3 Days Post-Op Procedure(s) (LRB): LEFT TOTAL HIP ARTHROPLASTY ANTERIOR APPROACH (Left)    Patient reports pain as mild.  Comfortable night with good night sleep with Xanax  Objective:   VITALS:   Filed Vitals:   10/26/12 0520  BP: 135/66  Pulse: 69  Temp: 98.2 F (36.8 C)  Resp: 16    Neurovascular intact Incision: dressing C/D/I  LABS  Recent Labs  10/24/12 0500 10/25/12 0515 10/25/12 2035  HGB 8.4* 7.9* 9.0*  HCT 24.8* 23.5* 26.8*  WBC 9.9 8.7  --   PLT 198 168  --      Recent Labs  10/24/12 0500 10/25/12 0515  NA 133* 132*  K 4.5 4.2  BUN 10 10  CREATININE 0.57 0.58  GLUCOSE 123* 112*    No results found for this basename: LABPT, INR,  in the last 72 hours   Assessment/Plan: 3 Days Post-Op Procedure(s) (LRB): LEFT TOTAL HIP ARTHROPLASTY ANTERIOR APPROACH (Left)   Discharge to SNF today after therapy RTC 2 weeks

## 2012-10-26 NOTE — Clinical Social Work Placement (Signed)
     Clinical Social Work Department CLINICAL SOCIAL WORK PLACEMENT NOTE 10/26/2012  Patient:  Meghan Sexton, Meghan Sexton  Account Number:  [id number] Admit date:  10/23/2012  Clinical Social Worker:  Becky Sax, LCSW  Date/time:  10/26/2012 12:00 M  Clinical Social Work is seeking post-discharge placement for this patient at the following level of care:   SKILLED NURSING   (*CSW will update this form in Epic as items are completed)   10/26/2012  Patient/family provided with [facility] Department of Clinical Social Works list of facilities offering this level of care within the geographic area requested by the patient (or if unable, by the patients family).  10/26/2012  Patient/family informed of their freedom to choose among providers that offer the needed level of care, that participate in Medicare, Medicaid or managed care program needed by the patient, have an available bed and are willing to accept the patient.  10/26/2012  Patient/family informed of [facility] ownership interest in [facility], as well as of the fact that they are under no obligation to receive care at this facility.  PASARR submitted to EDS on 10/26/2012 PASARR number received from [employer] on 10/26/2012  FL2 transmitted to all facilities in geographic area requested by pt/family on  10/26/2012 FL2 transmitted to all facilities within larger geographic area on 10/26/2012  Patient informed that his/her managed care company has contracts with or will negotiate with  certain facilities, including the following:     Patient/family informed of bed offers received:  10/26/2012 Patient chooses bed at [facility] Physician recommends and patient chooses bed at    Patient to be transferred to [facility] on  10/26/2012 Patient to be transferred to facility by [facility]  The following physician request were entered in Epic:   Additional Comments:

## 2012-10-26 NOTE — Progress Notes (Signed)
Patient cleared for discharge. Packet copied and placed in [place]. Patient to go to [facility] after lunch. Patient would like to go by ambulance. Patient understands that there is no guarantee of payment. Patient expressed gratitude to CSW for assistance.  Clotiel Troop C. Kyerra Vargo MSW, LCSW [phone]

## 2012-10-26 NOTE — Progress Notes (Signed)
Physical Therapy Treatment Patient Details Name: Meghan Sexton MRN: [id number] DOB: 26-Dec-1923 Today's Date: 10/26/2012 Time: 0951-1020 PT Time Calculation (min): 29 min  PT Assessment / Plan / Recommendation  History of Present Illness 79 yo female s/p L THA-DA 10/14   PT Comments   Progressing with mobility. Plan is for [facility] today.   Follow Up Recommendations  SNF     Does the patient have the potential to tolerate intense rehabilitation     Barriers to Discharge        Equipment Recommendations  None recommended by PT    Recommendations for Other Services OT consult  Frequency 7X/week   Progress towards PT Goals Progress towards PT goals: Progressing toward goals  Plan Current plan remains appropriate    Precautions / Restrictions Precautions Precautions: Fall Restrictions Weight Bearing Restrictions: No LLE Weight Bearing: Weight bearing as tolerated   Pertinent Vitals/Pain 5/10 L hip with activity   Mobility  Bed Mobility Bed Mobility: Supine to Sit;Sit to Supine Supine to Sit: 4: Min assist Sit to Supine: 4: Min assist Details for Bed Mobility Assistance: assist for LLE Transfers Transfers: Sit to Stand;Stand to Sit Sit to Stand: 4: Min guard;From bed;From chair/3-in-1 Stand to Sit: 4: Min guard;To chair/3-in-1;To bed Details for Transfer Assistance: close guard for safety Ambulation/Gait Ambulation/Gait Assistance: 4: Min guard Ambulation Distance (Feet): 100 Feet Assistive device: Rolling walker Ambulation/Gait Assistance Details: close guard for safety    Exercises Total Joint Exercises Ankle Circles/Pumps: AROM;Both;10 reps;Supine Quad Sets: AROM;Both;10 reps;Supine Heel Slides: AAROM;Left;10 reps;Supine Hip ABduction/ADduction: AAROM;Left;10 reps;Supine   PT Diagnosis:    PT Problem List:   PT Treatment Interventions:     PT Goals (current goals can now be found in the care plan section)    Visit Information  Last PT Received On:  10/26/12 Assistance Needed: +1 History of Present Illness: 79 yo female s/p L THA-DA 10/14    Subjective Data      Cognition  Cognition Arousal/Alertness: Awake/alert Behavior During Therapy: WFL for tasks assessed/performed Overall Cognitive Status: Within Functional Limits for tasks assessed    Balance     End of Session PT - End of Session Activity Tolerance: Patient tolerated treatment well Patient left: in bed;with call bell/phone within reach   GP     Rebeca Alert, MPT Pager: [phone]

## 2012-10-26 NOTE — Progress Notes (Signed)
Occupational Therapy Treatment Patient Details Name: Meghan Sexton MRN: [id number] DOB: Aug 21, 1923 Today's Date: 10/26/2012 Time: 4098-1191 OT Time Calculation (min): 25 min  OT Assessment / Plan / Recommendation  History of present illness 79 yo female s/p L THA-DA 10/14   OT comments    Follow Up Recommendations  SNF    Barriers to Discharge       Equipment Recommendations  None recommended by OT;Other (comment)    Recommendations for Other Services    Frequency Min 2X/week   Progress towards OT Goals Progress towards OT goals: Progressing toward goals  Plan      Precautions / Restrictions Precautions Precautions: Fall Restrictions Weight Bearing Restrictions: No LLE Weight Bearing: Weight bearing as tolerated   Pertinent Vitals/Pain 5/10 L hip.  Premedicated and repositioned back in bed    ADL  Grooming: Supervision/safety;Wash/dry face Where Assessed - Grooming: Supported standing Lower Body Bathing: Moderate assistance Where Assessed - Lower Body Bathing: Supported sit to stand Toilet Transfer: Hydrographic surveyor Method: Sit to Barista: Materials engineer and Hygiene: Minimal assistance Where Assessed - Engineer, mining and Hygiene: Standing Equipment Used: Reacher Transfers/Ambulation Related to ADLs: ambulated from bathroom with min guard assistance ADL Comments: Pt stood for 3 minutes during adls.  encouraged to sit to conserve energy.  Pt sat in chair to change gown then transferred back to bed    OT Diagnosis:    OT Problem List:   OT Treatment Interventions:     OT Goals(current goals can now be found in the care plan section)    Visit Information  Last OT Received On: 10/26/12 Assistance Needed: +1 History of Present Illness: 79 yo female s/p L THA-DA 10/14    Subjective Data      Prior Functioning       Cognition  Cognition Behavior During Therapy: WFL for  tasks assessed/performed Overall Cognitive Status: Within Functional Limits for tasks assessed    Mobility  Bed Mobility Sit to Supine: 4: Min assist Details for Bed Mobility Assistance: assist for LLE Transfers Sit to Stand: 4: Min guard;From toilet;From chair/3-in-1;With armrests Details for Transfer Assistance: cues for hand placement    Exercises      Balance     End of Session OT - End of Session Activity Tolerance: Patient tolerated treatment well Patient left: in bed;with call bell/phone within reach  GO     [facility] 10/26/2012, 9:52 AM Marica Otter, OTR/L [phone] 10/26/2012

## 2012-10-29 ENCOUNTER — Non-Acute Institutional Stay (SKILLED_NURSING_FACILITY): Payer: Medicare Other | Admitting: Adult Health

## 2012-10-29 DIAGNOSIS — K5900 Constipation, unspecified: Secondary | ICD-10-CM

## 2012-10-29 DIAGNOSIS — D62 Acute posthemorrhagic anemia: Secondary | ICD-10-CM

## 2012-10-29 DIAGNOSIS — Z96649 Presence of unspecified artificial hip joint: Secondary | ICD-10-CM

## 2012-10-29 DIAGNOSIS — E039 Hypothyroidism, unspecified: Secondary | ICD-10-CM

## 2012-10-29 DIAGNOSIS — J45909 Unspecified asthma, uncomplicated: Secondary | ICD-10-CM

## 2012-10-29 DIAGNOSIS — G4700 Insomnia, unspecified: Secondary | ICD-10-CM

## 2012-10-29 DIAGNOSIS — K219 Gastro-esophageal reflux disease without esophagitis: Secondary | ICD-10-CM

## 2012-10-29 DIAGNOSIS — I10 Essential (primary) hypertension: Secondary | ICD-10-CM

## 2012-11-07 ENCOUNTER — Non-Acute Institutional Stay (SKILLED_NURSING_FACILITY): Payer: Medicare Other | Admitting: Internal Medicine

## 2012-11-07 DIAGNOSIS — M1610 Unilateral primary osteoarthritis, unspecified hip: Secondary | ICD-10-CM

## 2012-11-07 DIAGNOSIS — D62 Acute posthemorrhagic anemia: Secondary | ICD-10-CM

## 2012-11-07 DIAGNOSIS — I10 Essential (primary) hypertension: Secondary | ICD-10-CM

## 2012-11-07 DIAGNOSIS — E039 Hypothyroidism, unspecified: Secondary | ICD-10-CM

## 2012-11-28 ENCOUNTER — Non-Acute Institutional Stay (SKILLED_NURSING_FACILITY): Payer: Medicare Other | Admitting: Adult Health

## 2012-11-28 DIAGNOSIS — G4700 Insomnia, unspecified: Secondary | ICD-10-CM

## 2012-11-28 DIAGNOSIS — I10 Essential (primary) hypertension: Secondary | ICD-10-CM

## 2012-11-28 DIAGNOSIS — Z96649 Presence of unspecified artificial hip joint: Secondary | ICD-10-CM

## 2012-11-28 DIAGNOSIS — E039 Hypothyroidism, unspecified: Secondary | ICD-10-CM

## 2012-11-28 DIAGNOSIS — J45909 Unspecified asthma, uncomplicated: Secondary | ICD-10-CM

## 2012-11-28 DIAGNOSIS — K219 Gastro-esophageal reflux disease without esophagitis: Secondary | ICD-10-CM

## 2012-11-28 DIAGNOSIS — D62 Acute posthemorrhagic anemia: Secondary | ICD-10-CM

## 2012-11-28 DIAGNOSIS — K5900 Constipation, unspecified: Secondary | ICD-10-CM

## 2012-12-03 DIAGNOSIS — E039 Hypothyroidism, unspecified: Secondary | ICD-10-CM | POA: Insufficient documentation

## 2012-12-03 DIAGNOSIS — D62 Acute posthemorrhagic anemia: Secondary | ICD-10-CM | POA: Insufficient documentation

## 2012-12-03 DIAGNOSIS — M1610 Unilateral primary osteoarthritis, unspecified hip: Secondary | ICD-10-CM | POA: Insufficient documentation

## 2012-12-03 NOTE — Progress Notes (Signed)
Patient ID: Meghan Sexton, female   DOB: Jul 14, 1923, 79 y.o.   MRN: [id number]        HISTORY & PHYSICAL  DATE: 11/07/2012   FACILITY: [facility]  LEVEL OF CARE: SNF (31)  ALLERGIES:  Allergies  Allergen Reactions  . Codeine Nausea And Vomiting  . Penicillins Other (See Comments)    Nodules in legs   . Sulfonamide Derivatives Nausea Only    CHIEF COMPLAINT:  Manage left hip osteoarthritis, acute blood loss anemia, and hypothyroidism.    HISTORY OF PRESENT ILLNESS:  The patient is an 79 year-old, Caucasian female.    HIP OSTEOARTHRITIS: patient had advanced end stage OA of the hip with progressively worsening pain & dysfunction.  The patient has periarticular osteophytes and joint space narrowing by imaging studies.  Pt failed non-surgical conservative management.  Therefore pt underwent total hip arthroplasty & tolerated the procedure well.  Pt denies hip pain currently.  Pt was admitted to this facility for short term rehabilitation.    ANEMIA:  Postoperatively, patient suffered acute blood loss.   The anemia has been stable. The patient denies fatigue, melena or hematochezia. No complications from the medications currently being used.  Last hemoglobins are: 8.4, 7.9.    HYPOTHYROIDISM: The hypothyroidism remains stable. No complications noted from the medications presently being used.  The patient denies fatigue or constipation.  Last TSH:  Not available.      PAST MEDICAL HISTORY :  Past Medical History  Diagnosis Date  . Hypertension   . Asthma   . Hypothyroidism   . Constipation   . Blood transfusion 2010     1 unit after each back surgery  . Sleep apnea     stopbang=4  . Anxiety   . Depression   . GERD (gastroesophageal reflux disease)   . Arthritis     PAST SURGICAL HISTORY: Past Surgical History  Procedure Laterality Date  . Whole back surgery for scioliosis   rods inserted ,with last surgery 2010    surgery done x 3  times--05/27/2005,05/2009,10/19/2009  . Right rotator cuff repair  09/07/1993  . Malignant melanoa removed from right shoulder   35yrs ago  . Melignant melanoma removed from near left shin  6 yrs ago  . Bunion removed from left foot  50 yrs ago  . Lens implants both eyes  15 yrs ago  . Total knee arthroplasty  04/04/2011    Procedure: TOTAL KNEE ARTHROPLASTY;  Surgeon: [name], MD;  Location: [facility];  Service: Orthopedics;  Laterality: Left;  . Tonsillectomy  1936  . Appendectomy  1956    with suspension of uterus  . Tubal ligation  yrs ago    34  . Hernia repair  yrs ago    1958  . Cholecystectomy  yrs ago    23  . Eye surgery  12/1995,01/1996    bilateral cararacts with lens implants  . Hiatal hernia repair  01/09/2004    large paraesophageal  . Abdominal hysterectomy  1964- age 31    partial  . Back surgery  05/27/2005    for lumbar scoliotic deformity  . Breast surgery  2010    right breast-benign  . Anterior /posterior rectocele  07/12/1991    and ovarian cystectomy  . Total hip arthroplasty Left 10/23/2012    Procedure: LEFT TOTAL HIP ARTHROPLASTY ANTERIOR APPROACH;  Surgeon: [name], MD;  Location: [facility];  Service: Orthopedics;  Laterality: Left;  SOCIAL HISTORY:  reports that she has never smoked. She has never used smokeless tobacco. She reports that she does not drink alcohol or use illicit drugs.  FAMILY HISTORY: None   CURRENT MEDICATIONS: Reviewed per MAR  REVIEW OF SYSTEMS:  See HPI otherwise 14 point ROS is negative.  PHYSICAL EXAMINATION  VS:  T 97.2       P 77      RR 14      BP 97/57      POX 97% room air        WT (Lb)  GENERAL: no acute distress, normal body habitus EYES: conjunctivae normal, sclerae normal, normal eye lids MOUTH/THROAT: lips without lesions,no lesions in the mouth,tongue is without lesions,uvula elevates in midline NECK: supple, trachea midline, no neck masses, no thyroid tenderness, no thyromegaly LYMPHATICS:  no LAN in the neck, no supraclavicular LAN RESPIRATORY: breathing is even & unlabored, BS CTAB CARDIAC: RRR, no murmur,no extra heart sounds, no edema GI:  ABDOMEN: abdomen soft, normal BS, no masses, no tenderness  LIVER/SPLEEN: no hepatomegaly, no splenomegaly MUSCULOSKELETAL: HEAD: normal to inspection & palpation BACK: no kyphosis, scoliosis or spinal processes tenderness EXTREMITIES: LEFT UPPER EXTREMITY: full range of motion, normal strength & tone RIGHT UPPER EXTREMITY:  full range of motion, normal strength & tone LEFT LOWER EXTREMITY: strength intact, range of motion not tested due to surgery    RIGHT LOWER EXTREMITY: strength intact, range of motion moderate   PSYCHIATRIC: the patient is alert & oriented to person, affect & behavior appropriate  LABS/RADIOLOGY: Hemoglobin 9, WBC 8.7, platelets 168.    Glucose 112, otherwise BMP normal.    ASSESSMENT/PLAN:  Left hip osteoarthritis.  Status post left total hip arthroplasty.  Continue rehabilitation.    Acute blood loss anemia.  Status post transfusion.  Continue iron.    Hypothyroidism.  Continue levothyroxine.    Hypertension.  Well controlled.    Asthma.  Well compensated.    GERD.  Well controlled.     Check CBC and BMP.    I have reviewed patient's medical records received at admission/from hospitalization.  CPT CODE: 16109

## 2012-12-04 DIAGNOSIS — J45909 Unspecified asthma, uncomplicated: Secondary | ICD-10-CM | POA: Insufficient documentation

## 2012-12-04 DIAGNOSIS — G4700 Insomnia, unspecified: Secondary | ICD-10-CM | POA: Insufficient documentation

## 2012-12-04 NOTE — Progress Notes (Signed)
Patient ID: Meghan Sexton, female   DOB: 1923-03-08, 79 y.o.   MRN: [id number]                         PROGRESS NOTE  DATE: 10/29/2012  FACILITY: Nursing Home Location: [facility]  LEVEL OF CARE: SNF (31)  Acute Visit  CHIEF COMPLAINT:  Follow-up hospitalization  HISTORY OF PRESENT ILLNESS: This is an 79 year old female who has been admitted to [facility] on 10/26/12 from [facility] with Left hip osteoarthritis S/P Left total hip arthroplasty. She has been admitted for a short-term rehabilitation.  REASSESSMENT OF ONGOING PROBLEM(S):  HTN: Pt 's HTN remains stable.  Denies CP, sob, DOE, pedal edema, headaches, dizziness or visual disturbances.  No complications from the medications currently being used.  Last BP : 109/50  ASTHMA: The patient's asthma remains stable. Patient denies shortness of breath, dyspnea on exertion or wheezing. No complications reported from the medications currently being used.  GERD: pt's GERD is stable.  Denies ongoing heartburn, abd. Pain, nausea or vomiting.  Currently on a PPI & tolerates it without any adverse reactions.   PAST MEDICAL HISTORY : Reviewed.  No changes.  CURRENT MEDICATIONS: Reviewed per [facility]  REVIEW OF SYSTEMS:  GENERAL: no change in appetite, no fatigue, no weight changes, no fever, chills or weakness RESPIRATORY: no cough, SOB, DOE, wheezing, hemoptysis CARDIAC: no chest pain, edema or palpitations GI: no abdominal pain, diarrhea, constipation, heart burn, nausea or vomiting  PHYSICAL EXAMINATION  VS:  T97.2       P69      RR20     BP109/50         WT137.8 (Lb)  GENERAL: no acute distress, normal body habitus EYES: conjunctivae normal, sclerae normal, normal eye lids NECK: supple, trachea midline, no neck masses, no thyroid tenderness, no thyromegaly LYMPHATICS: no LAN in the neck, no supraclavicular LAN RESPIRATORY: breathing is even & unlabored, BS CTAB CARDIAC: RRR, no murmur,no extra heart  sounds, no edema GI: abdomen soft, normal BS, no masses, no tenderness, no hepatomegaly, no splenomegaly PSYCHIATRIC: the patient is alert & oriented to person, affect & behavior appropriate  LABS/RADIOLOGY: 10/25/12 WBC 8.7 hemoglobin 9.0 hematocrit 26.8 sodium 132 potassium 4.2 glucose 112 BUN and creatinine 0.58 calcium 9.3   ASSESSMENT/PLAN:  Osteoarthritis status post left total hip arthroplasty - for rehabitation  Asthma - well controlled  GERD - continue Protonix  Hypothyroidism - continue Synthroid  Constipation - no complaints  Insomnia - continue trazodone  Hypertension - well-controlled    CPT CODE: 10272

## 2012-12-04 NOTE — Progress Notes (Signed)
Patient ID: Meghan Sexton, female   DOB: April 29, 1923, 79 y.o.   MRN: [id number]              PROGRESS NOTE  DATE: 11/28/2012   FACILITY:   [facility]  LEVEL OF CARE:    SNF (31)  Acute Visit  CHIEF COMPLAINT: Discharge Notes  HISTORY OF PRESENT ILLNESS:This is an 79 year old female who is for discharge home with Home health PT and OT. She has been admitted to [facility] on 10/26/12 from [facility] with Left hip osteoarthritis S/P Left total hip arthroplasty.  Patient was admitted to this facility for short-term rehabilitation after the patient's recent hospitalization.  Patient has completed SNF rehabilitation and therapy has cleared the patient for discharge.  Reassessment of ongoing problem(s):  HTN: Pt 's HTN remains stable.  Denies CP, sob, DOE, pedal edema, headaches, dizziness or visual disturbances.  No complications from the medications currently being used.  Last BP : 132/60  ANEMIA: The anemia has been stable. The patient denies fatigue, melena or hematochezia. No complications from the medications currently being used. 10/14 hgb 9.0  CONSTIPATION: The constipation remains stable. No complications from the medications presently being used. Patient denies ongoing constipation, abdominal pain, nausea or vomiting.  PAST MEDICAL HISTORY : Reviewed.  No changes.  CURRENT MEDICATIONS: Reviewed per [facility]  REVIEW OF SYSTEMS:  GENERAL: no change in appetite, no fatigue, no weight changes, no fever, chills or weakness RESPIRATORY: no cough, SOB, DOE, wheezing, hemoptysis CARDIAC: no chest pain, edema or palpitations GI: no abdominal pain, diarrhea, constipation, heart burn, nausea or vomiting  PHYSICAL EXAMINATION  VS:  T97       P64       RR16      BP132/60      POX95 %       WT (Lb)135.4  GENERAL: no acute distress, normal body habitus NECK: supple, trachea midline, no neck masses, no thyroid tenderness, no thyromegaly LYMPHATICS: no LAN in the  neck, no supraclavicular LAN RESPIRATORY: breathing is even & unlabored, BS CTAB CARDIAC: RRR, no murmur,no extra heart sounds, no edema GI: abdomen soft, normal BS, no masses, no tenderness, no hepatomegaly, no splenomegaly PSYCHIATRIC: the patient is alert & oriented to person, affect & behavior appropriate  LABS/RADIOLOGY: 10/25/12 WBC 8.7 hemoglobin 9.0 hematocrit 26.8 sodium 132 potassium 4.2 glucose 112 BUN and creatinine 0.58 calcium 9.3   ASSESSMENT/PLAN:  Osteoarthritis status post left total hip arthroplasty - for Home health PT and OT  Asthma - well controlled  GERD - continue Protonix  Hypothyroidism - continue Synthroid  Constipation - no complaints; continue Miralax and Colace PRN  Insomnia - continue trazodone  Hypertension - well-controlled   I have filled out patient's discharge paperwork and written prescriptions.  Patient will receive home health PT and OT.  Total discharge time: Less than 30 minutes Discharge time involved coordination of the discharge process with Child psychotherapist, nursing staff and therapy department. Medical justification for home health services verified.  CPT CODE: 40981

## 2012-12-19 ENCOUNTER — Inpatient Hospital Stay (HOSPITAL_BASED_OUTPATIENT_CLINIC_OR_DEPARTMENT_OTHER)
Admission: EM | Admit: 2012-12-19 | Discharge: 2012-12-24 | DRG: 194 | Disposition: A | Discharge status: Home / Self Care | Payer: Medicare Other | Attending: Internal Medicine | Admitting: Internal Medicine

## 2012-12-19 ENCOUNTER — Encounter (HOSPITAL_BASED_OUTPATIENT_CLINIC_OR_DEPARTMENT_OTHER): Payer: Self-pay | Admitting: Emergency Medicine

## 2012-12-19 ENCOUNTER — Emergency Department (HOSPITAL_BASED_OUTPATIENT_CLINIC_OR_DEPARTMENT_OTHER): Payer: Medicare Other

## 2012-12-19 DIAGNOSIS — D649 Anemia, unspecified: Secondary | ICD-10-CM | POA: Diagnosis present

## 2012-12-19 DIAGNOSIS — J111 Influenza due to unidentified influenza virus with other respiratory manifestations: Secondary | ICD-10-CM | POA: Diagnosis present

## 2012-12-19 DIAGNOSIS — Z96649 Presence of unspecified artificial hip joint: Secondary | ICD-10-CM

## 2012-12-19 DIAGNOSIS — M545 Low back pain, unspecified: Secondary | ICD-10-CM | POA: Diagnosis present

## 2012-12-19 DIAGNOSIS — M1990 Unspecified osteoarthritis, unspecified site: Secondary | ICD-10-CM | POA: Diagnosis present

## 2012-12-19 DIAGNOSIS — J189 Pneumonia, unspecified organism: Principal | ICD-10-CM | POA: Diagnosis present

## 2012-12-19 DIAGNOSIS — E875 Hyperkalemia: Secondary | ICD-10-CM | POA: Diagnosis not present

## 2012-12-19 DIAGNOSIS — E039 Hypothyroidism, unspecified: Secondary | ICD-10-CM | POA: Diagnosis present

## 2012-12-19 DIAGNOSIS — N390 Urinary tract infection, site not specified: Secondary | ICD-10-CM

## 2012-12-19 DIAGNOSIS — J449 Chronic obstructive pulmonary disease, unspecified: Secondary | ICD-10-CM | POA: Diagnosis present

## 2012-12-19 DIAGNOSIS — G4730 Sleep apnea, unspecified: Secondary | ICD-10-CM | POA: Diagnosis present

## 2012-12-19 DIAGNOSIS — E785 Hyperlipidemia, unspecified: Secondary | ICD-10-CM | POA: Diagnosis present

## 2012-12-19 DIAGNOSIS — J4489 Other specified chronic obstructive pulmonary disease: Secondary | ICD-10-CM | POA: Diagnosis present

## 2012-12-19 DIAGNOSIS — E871 Hypo-osmolality and hyponatremia: Secondary | ICD-10-CM

## 2012-12-19 DIAGNOSIS — F411 Generalized anxiety disorder: Secondary | ICD-10-CM | POA: Diagnosis present

## 2012-12-19 DIAGNOSIS — I10 Essential (primary) hypertension: Secondary | ICD-10-CM | POA: Diagnosis present

## 2012-12-19 DIAGNOSIS — G8929 Other chronic pain: Secondary | ICD-10-CM | POA: Diagnosis present

## 2012-12-19 DIAGNOSIS — F3289 Other specified depressive episodes: Secondary | ICD-10-CM | POA: Diagnosis present

## 2012-12-19 DIAGNOSIS — R630 Anorexia: Secondary | ICD-10-CM | POA: Diagnosis present

## 2012-12-19 DIAGNOSIS — K5900 Constipation, unspecified: Secondary | ICD-10-CM | POA: Diagnosis present

## 2012-12-19 DIAGNOSIS — D72829 Elevated white blood cell count, unspecified: Secondary | ICD-10-CM | POA: Diagnosis present

## 2012-12-19 DIAGNOSIS — K219 Gastro-esophageal reflux disease without esophagitis: Secondary | ICD-10-CM | POA: Diagnosis present

## 2012-12-19 DIAGNOSIS — R627 Adult failure to thrive: Secondary | ICD-10-CM | POA: Diagnosis present

## 2012-12-19 DIAGNOSIS — Z96659 Presence of unspecified artificial knee joint: Secondary | ICD-10-CM

## 2012-12-19 DIAGNOSIS — F329 Major depressive disorder, single episode, unspecified: Secondary | ICD-10-CM | POA: Diagnosis present

## 2012-12-19 LAB — URINALYSIS, ROUTINE W REFLEX MICROSCOPIC
Bilirubin Urine: NEGATIVE
Glucose, UA: NEGATIVE mg/dL
Ketones, ur: NEGATIVE mg/dL
Nitrite: NEGATIVE
Specific Gravity, Urine: 1.015 (ref 1.005–1.030)
Urobilinogen, UA: 1 mg/dL (ref 0.0–1.0)

## 2012-12-19 LAB — COMPREHENSIVE METABOLIC PANEL
ALT: 12 U/L (ref 0–35)
Alkaline Phosphatase: 96 U/L (ref 39–117)
BUN: 16 mg/dL (ref 6–23)
CO2: 27 mEq/L (ref 19–32)
Calcium: 10.5 mg/dL (ref 8.4–10.5)
GFR calc Af Amer: 88 mL/min — ABNORMAL LOW (ref >90)
GFR calc non Af Amer: 76 mL/min — ABNORMAL LOW (ref >90)
Glucose, Bld: 118 mg/dL — ABNORMAL HIGH (ref 70–99)
Potassium: 4 mEq/L (ref 3.5–5.1)
Sodium: 133 mEq/L — ABNORMAL LOW (ref 135–145)
Total Protein: 6.9 g/dL (ref 6.0–8.3)

## 2012-12-19 LAB — CBC WITH DIFFERENTIAL/PLATELET
Basophils Relative: 0 % (ref 0–1)
Eosinophils Relative: 0 % (ref 0–5)
HCT: 38.2 % (ref 36.0–46.0)
Lymphocytes Relative: 3 % — ABNORMAL LOW (ref 12–46)
MCH: 31.3 pg (ref 26.0–34.0)
MCV: 94.1 fL (ref 78.0–100.0)
Monocytes Absolute: 1.6 10*3/uL — ABNORMAL HIGH (ref 0.1–1.0)
Neutro Abs: 24.3 10*3/uL — ABNORMAL HIGH (ref 1.7–7.7)
Neutrophils Relative %: 91 % — ABNORMAL HIGH (ref 43–77)
WBC: 26.7 10*3/uL — ABNORMAL HIGH (ref 4.0–10.5)

## 2012-12-19 LAB — URINE MICROSCOPIC-ADD ON

## 2012-12-19 MED ORDER — DEXTROSE 5 % IV SOLN
500.0000 mg | Freq: Once | INTRAVENOUS | Status: AC
  Administered 2012-12-19: 500 mg via INTRAVENOUS

## 2012-12-19 MED ORDER — ACETAMINOPHEN 325 MG PO TABS
650.0000 mg | ORAL_TABLET | Freq: Once | ORAL | Status: AC
  Administered 2012-12-19: 650 mg via ORAL
  Filled 2012-12-19: qty 2

## 2012-12-19 MED ORDER — DEXTROSE 5 % IV SOLN
1.0000 g | Freq: Once | INTRAVENOUS | Status: AC
  Administered 2012-12-20: 1 g via INTRAVENOUS

## 2012-12-19 NOTE — ED Provider Notes (Signed)
CSN: [id number]     Arrival date & time 12/19/12  2048 History   First MD Initiated Contact with Patient 12/19/12 2309    This chart was scribed for Hanley Seamen, MD by Ellin Mayhew, ED Scribe. This patient was seen in room MH07/MH07 and the patient's care was started at 11:10 PM.  Chief Complaint  Patient presents with  . Cough and Chills    (Consider location/radiation/quality/duration/timing/severity/associated sxs/prior Treatment) The history is provided by the patient and a relative. No language interpreter was used.   HPI Comments: Meghan Sexton is a 79 y.o. female who presents to the Emergency Department complaining of a productive cough (yellow-white sputum) with associated chills, HA, myalgias, nausea, and congestion that began earlier tonight at 7:00pm. In the past several weeks she has had a "cold" and a "stomach bug". Pt. States she has abdominal pain in the suprapubic region and SOB, worse with exertion, that has been ongoing since the before Thanksgiving. Pt denies diarrhea, any urinary symptoms, CP and vomiting though she has had a loose stool today.  She has taken an OTC medication with minimal relief. Patient reports receiving influenza vaccine. Pt also recently had a hip replacement.   Past Medical History  Diagnosis Date  . Hypertension   . Asthma   . Hypothyroidism   . Constipation   . Blood transfusion 2010     1 unit after each back surgery  . Sleep apnea     stopbang=4  . Anxiety   . Depression   . GERD (gastroesophageal reflux disease)   . Arthritis    Past Surgical History  Procedure Laterality Date  . Whole back surgery for scioliosis   rods inserted ,with last surgery 2010    surgery done x 3 times--05/27/2005,05/2009,10/19/2009  . Right rotator cuff repair  09/07/1993  . Malignant melanoa removed from right shoulder   37yrs ago  . Melignant melanoma removed from near left shin  6 yrs ago  . Bunion removed from left foot  50 yrs ago  . Lens  implants both eyes  15 yrs ago  . Total knee arthroplasty  04/04/2011    Procedure: TOTAL KNEE ARTHROPLASTY;  Surgeon: [name], MD;  Location: [facility];  Service: Orthopedics;  Laterality: Left;  . Tonsillectomy  1936  . Appendectomy  1956    with suspension of uterus  . Tubal ligation  yrs ago    34  . Hernia repair  yrs ago    1958  . Cholecystectomy  yrs ago    67  . Eye surgery  12/1995,01/1996    bilateral cararacts with lens implants  . Hiatal hernia repair  01/09/2004    large paraesophageal  . Abdominal hysterectomy  1964- age 79    partial  . Back surgery  05/27/2005    for lumbar scoliotic deformity  . Breast surgery  2010    right breast-benign  . Anterior /posterior rectocele  07/12/1991    and ovarian cystectomy  . Total hip arthroplasty Left 10/23/2012    Procedure: LEFT TOTAL HIP ARTHROPLASTY ANTERIOR APPROACH;  Surgeon: [name], MD;  Location: [facility];  Service: Orthopedics;  Laterality: Left;   No family history on file. History  Substance Use Topics  . Smoking status: Never Smoker   . Smokeless tobacco: Never Used  . Alcohol Use: No   OB History   Grav Para Term Preterm Abortions TAB SAB Ect Mult Living  Review of Systems  A complete 10 system review of systems was obtained and all systems are negative except as noted in the HPI and PMH.    Allergies  Codeine; Penicillins; and Sulfonamide derivatives  Home Medications   Current Outpatient Rx  Name  Route  Sig  Dispense  Refill  . albuterol (PROVENTIL HFA;VENTOLIN HFA) 108 (90 BASE) MCG/ACT inhaler   Inhalation   Inhale 1-2 puffs into the lungs every 6 (six) hours as needed for wheezing (Use q 6 hours for next 3 days then prn).   1 Inhaler   0   . benazepril-hydrochlorthiazide (LOTENSIN HCT) 20-12.5 MG per tablet   Oral   Take 1 tablet by mouth every morning.         . budesonide (PULMICORT) 180 MCG/ACT inhaler   Inhalation   Inhale 2 puffs into the lungs  at bedtime.         Marland Kitchen CALCIUM & MAGNESIUM CARBONATES PO   Oral   Take 1 tablet by mouth every morning.          . cycloSPORINE (RESTASIS) 0.05 % ophthalmic emulsion   Both Eyes   Place 1 drop into both eyes every 12 (twelve) hours.         . diazepam (VALIUM) 2 MG tablet   Oral   Take 2 mg by mouth every 6 (six) hours as needed. For anxiety         . docusate sodium 100 MG CAPS   Oral   Take 100 mg by mouth 2 (two) times daily.   10 capsule   0   . fentaNYL (DURAGESIC - DOSED MCG/HR) 50 MCG/HR   Transdermal   Place 1 patch onto the skin every 3 (three) days.         Marland Kitchen EXPIRED: ferrous sulfate 325 (65 FE) MG tablet   Oral   Take 1 tablet (325 mg total) by mouth 3 (three) times daily after meals.         . ferrous sulfate 325 (65 FE) MG tablet   Oral   Take 1 tablet (325 mg total) by mouth 3 (three) times daily after meals.      3   . furosemide (LASIX) 40 MG tablet   Oral   Take 40 mg by mouth daily as needed for edema.         . gabapentin (NEURONTIN) 100 MG capsule   Oral   Take 100 mg by mouth 2 (two) times daily.         . hydrochlorothiazide (HYDRODIURIL) 12.5 MG tablet   Oral   Take 12.5 mg by mouth every morning.         Marland Kitchen HYDROcodone-acetaminophen (NORCO) 7.5-325 MG per tablet   Oral   Take 1-2 tablets by mouth every 4 (four) hours.   30 tablet   0   . hyoscyamine (ANASPAZ) 0.125 MG TBDP   Sublingual   Place 0.125 mg under the tongue every 4 (four) hours as needed. May take 1 to 2 tablets. For stomach cramps.         Marland Kitchen ipratropium (ATROVENT) 0.06 % nasal spray   Nasal   Place 2 sprays into the nose every morning.         Marland Kitchen levothyroxine (SYNTHROID, LEVOTHROID) 125 MCG tablet   Oral   Take 125 mcg by mouth every morning.         . methocarbamol (ROBAXIN) 500 MG tablet   Oral   Take  1 tablet (500 mg total) by mouth every 6 (six) hours as needed (muscle spasms).   50 tablet   0   . Multiple Vitamin (MULITIVITAMIN WITH  MINERALS) TABS   Oral   Take 1 tablet by mouth every morning.         . pantoprazole (PROTONIX) 40 MG tablet   Oral   Take 40 mg by mouth daily.          . polyethylene glycol (MIRALAX / GLYCOLAX) packet   Oral   Take 17 g by mouth 2 (two) times daily.   14 each   0   . quiNINE (QUALAQUIN) 324 MG capsule   Oral   Take 648 mg by mouth every 8 (eight) hours as needed. For muscle cramps in legs         . traZODone (DESYREL) 50 MG tablet   Oral   Take 50 mg by mouth at bedtime.         . vitamin C (ASCORBIC ACID) 250 MG tablet   Oral   Take 1,000 mg by mouth every morning.          Triage Vitals: BP 166/72  Pulse 100  Temp(Src) 99.1 F (37.3 C) (Oral)  Resp 18  Ht 4\' 10"  (1.473 m)  Wt 152 lb (68.947 kg)  BMI 31.78 kg/m2  SpO2 95%  Physical Exam General: Well-developed, well-nourished female in no acute distress; appearance consistent with age of record HENT: normocephalic; atraumatic Eyes: arcus senilis billaterally; pupils equal, round and reactive to light; extraocular muscles intact.  Neck: supple Heart: tachycardic and regular rhythm; no murmurs, rubs or gallops Lungs: distant sounds without frank wheezing; rattly cough. Abdomen: soft; nondistended; mild suprapubic tenderness; no masses or hepatosplenomegaly; bowel sounds present Extremities: No deformity; full range of motion; pulses normal Neurologic: Awake, alert and oriented; motor function intact in all extremities and symmetric; no facial droop Skin: Warm and dry Psychiatric: Normal mood and affect  ED Course  Procedures (including critical care time) Labs Review  DIAGNOSTIC STUDIES: Oxygen Saturation is 95% on room air, adequate by my interpretation.    COORDINATION OF CARE: 11:18 PM-Temperature 100.5 degrees orally. Scans and labs ordered. Discussed my suspicion of Treatment plan discussed with patient and patient agrees.   MDM   Nursing notes and vitals signs, including pulse oximetry,  reviewed.  Summary of this visit's results, reviewed by myself:  Labs:  Results for orders placed during the hospital encounter of 12/19/12 (from the past 24 hour(s))  CBC WITH DIFFERENTIAL     Status: Abnormal   Collection Time    12/19/12 10:00 PM      Result Value Range   WBC 26.7 (*) 4.0 - 10.5 K/uL   RBC 4.06  3.87 - 5.11 MIL/uL   Hemoglobin 12.7  12.0 - 15.0 g/dL   HCT 16.1  09.6 - 04.5 %   MCV 94.1  78.0 - 100.0 fL   MCH 31.3  26.0 - 34.0 pg   MCHC 33.2  30.0 - 36.0 g/dL   RDW 40.9  81.1 - 91.4 %   Platelets 271  150 - 400 K/uL   Neutrophils Relative % 91 (*) 43 - 77 %   Lymphocytes Relative 3 (*) 12 - 46 %   Monocytes Relative 6  3 - 12 %   Eosinophils Relative 0  0 - 5 %   Basophils Relative 0  0 - 1 %   Neutro Abs 24.3 (*) 1.7 - 7.7 K/uL  Lymphs Abs 0.8  0.7 - 4.0 K/uL   Monocytes Absolute 1.6 (*) 0.1 - 1.0 K/uL   Eosinophils Absolute 0.0  0.0 - 0.7 K/uL   Basophils Absolute 0.0  0.0 - 0.1 K/uL   RBC Morphology TEARDROP CELLS    COMPREHENSIVE METABOLIC PANEL     Status: Abnormal   Collection Time    12/19/12 10:00 PM      Result Value Range   Sodium 133 (*) 135 - 145 mEq/L   Potassium 4.0  3.5 - 5.1 mEq/L   Chloride 92 (*) 96 - 112 mEq/L   CO2 27  19 - 32 mEq/L   Glucose, Bld 118 (*) 70 - 99 mg/dL   BUN 16  6 - 23 mg/dL   Creatinine, Ser 4.09  0.50 - 1.10 mg/dL   Calcium 81.1  8.4 - 91.4 mg/dL   Total Protein 6.9  6.0 - 8.3 g/dL   Albumin 4.0  3.5 - 5.2 g/dL   AST 19  0 - 37 U/L   ALT 12  0 - 35 U/L   Alkaline Phosphatase 96  39 - 117 U/L   Total Bilirubin 0.4  0.3 - 1.2 mg/dL   GFR calc non Af Amer 76 (*) >90 mL/min   GFR calc Af Amer 88 (*) >90 mL/min  CG4 I-STAT (LACTIC ACID)     Status: Abnormal   Collection Time    12/19/12 10:35 PM      Result Value Range   Lactic Acid, Venous 2.78 (*) 0.5 - 2.2 mmol/L  URINALYSIS, ROUTINE W REFLEX MICROSCOPIC     Status: Abnormal   Collection Time    12/19/12 10:50 PM      Result Value Range   Color, Urine  YELLOW  YELLOW   APPearance CLEAR  CLEAR   Specific Gravity, Urine 1.015  1.005 - 1.030   pH 5.5  5.0 - 8.0   Glucose, UA NEGATIVE  NEGATIVE mg/dL   Hgb urine dipstick NEGATIVE  NEGATIVE   Bilirubin Urine NEGATIVE  NEGATIVE   Ketones, ur NEGATIVE  NEGATIVE mg/dL   Protein, ur NEGATIVE  NEGATIVE mg/dL   Urobilinogen, UA 1.0  0.0 - 1.0 mg/dL   Nitrite NEGATIVE  NEGATIVE   Leukocytes, UA SMALL (*) NEGATIVE  URINE MICROSCOPIC-ADD ON     Status: None   Collection Time    12/19/12 10:50 PM      Result Value Range   Squamous Epithelial / LPF RARE  RARE   WBC, UA 7-10  <3 WBC/hpf   RBC / HPF 0-2  <3 RBC/hpf    Imaging Studies: Dg Chest 2 View  12/19/2012   CLINICAL DATA:  Cough, chills.  History of hypertension and asthma.  EXAM: CHEST  2 VIEW  COMPARISON:  10/15/2012  FINDINGS: Patient has had previous posterior spinal fusion. Heart size is normal. There are no focal consolidations or pleural effusions. No pulmonary edema. Previous ligamentous repair of the right shoulder.  IMPRESSION: No evidence for acute cardiopulmonary abnormality.   Electronically Signed   By: Rosalie Gums M.D.   On: 12/19/2012 22:36     I personally performed the services described in this documentation, which was scribed in my presence.  The recorded information has been reviewed and is accurate.  11:25 PM Temperature now 100.5. Rocephin and Zithromax ordered for UTI and possible early pneumonia.. Influenza by PCR ordered.  Hanley Seamen, MD 12/19/12 2329

## 2012-12-19 NOTE — ED Notes (Addendum)
Cough, congestion 12 days, chills x 1 day,, denies vomiting,  Slight diarrhea

## 2012-12-19 NOTE — ED Notes (Signed)
Pt started having chills and coughing this evening after shopping all day.  Sts she has recently had a flulike illness and "stomach bug" but has been feeling better for past few days.

## 2012-12-20 ENCOUNTER — Encounter (HOSPITAL_COMMUNITY): Payer: Self-pay | Admitting: Internal Medicine

## 2012-12-20 ENCOUNTER — Inpatient Hospital Stay (HOSPITAL_COMMUNITY): Payer: Medicare Other

## 2012-12-20 DIAGNOSIS — J111 Influenza due to unidentified influenza virus with other respiratory manifestations: Secondary | ICD-10-CM | POA: Diagnosis present

## 2012-12-20 DIAGNOSIS — D72829 Elevated white blood cell count, unspecified: Secondary | ICD-10-CM | POA: Diagnosis present

## 2012-12-20 LAB — SEDIMENTATION RATE: Sed Rate: 9 mm/hr (ref 0–22)

## 2012-12-20 LAB — BASIC METABOLIC PANEL
CO2: 27 mEq/L (ref 19–32)
Calcium: 9.5 mg/dL (ref 8.4–10.5)
Chloride: 97 mEq/L (ref 96–112)
Creatinine, Ser: 0.62 mg/dL (ref 0.50–1.10)
GFR calc Af Amer: >90 mL/min (ref >90)
Potassium: 4.2 mEq/L (ref 3.5–5.1)
Sodium: 133 mEq/L — ABNORMAL LOW (ref 135–145)

## 2012-12-20 LAB — INFLUENZA PANEL BY PCR (TYPE A & B)
H1N1 flu by pcr: NOT DETECTED
Influenza A By PCR: NEGATIVE
Influenza B By PCR: NEGATIVE

## 2012-12-20 LAB — CBC
Hemoglobin: 11.2 g/dL — ABNORMAL LOW (ref 12.0–15.0)
MCH: 31 pg (ref 26.0–34.0)
MCHC: 33.1 g/dL (ref 30.0–36.0)
MCV: 93.6 fL (ref 78.0–100.0)
Platelets: 242 10*3/uL (ref 150–400)
RBC: 3.61 MIL/uL — ABNORMAL LOW (ref 3.87–5.11)
WBC: 35.7 10*3/uL — ABNORMAL HIGH (ref 4.0–10.5)

## 2012-12-20 LAB — CREATININE, SERUM: Creatinine, Ser: 0.7 mg/dL (ref 0.50–1.10)

## 2012-12-20 MED ORDER — ALBUTEROL SULFATE HFA 108 (90 BASE) MCG/ACT IN AERS: 1.0000 | INHALATION_SPRAY | Freq: Four times a day (QID) | RESPIRATORY_TRACT | Status: DC | PRN

## 2012-12-20 MED ORDER — ENSURE COMPLETE PO LIQD
237.0000 mL | Freq: Three times a day (TID) | ORAL | Status: DC
  Administered 2012-12-20 – 2012-12-24 (×12): 237 mL via ORAL

## 2012-12-20 MED ORDER — DEXTROSE 5 % IV SOLN
1.0000 g | INTRAVENOUS | Status: DC
  Administered 2012-12-20 – 2012-12-23 (×4): 1 g via INTRAVENOUS
  Filled 2012-12-20 (×4): qty 10

## 2012-12-20 MED ORDER — FLUTICASONE PROPIONATE HFA 44 MCG/ACT IN AERO
2.0000 | INHALATION_SPRAY | Freq: Two times a day (BID) | RESPIRATORY_TRACT | Status: DC
  Administered 2012-12-20 – 2012-12-24 (×9): 2 via RESPIRATORY_TRACT
  Filled 2012-12-20: qty 10.6

## 2012-12-20 MED ORDER — SODIUM CHLORIDE 0.9 % IV BOLUS (SEPSIS)
250.0000 mL | Freq: Once | INTRAVENOUS | Status: AC
  Administered 2012-12-20: 14:00:00 250 mL via INTRAVENOUS

## 2012-12-20 MED ORDER — METHOCARBAMOL 500 MG PO TABS
500.0000 mg | ORAL_TABLET | Freq: Four times a day (QID) | ORAL | Status: DC | PRN
  Filled 2012-12-20: qty 1

## 2012-12-20 MED ORDER — HYDROCODONE-ACETAMINOPHEN 5-325 MG PO TABS
1.0000 | ORAL_TABLET | ORAL | Status: DC | PRN
  Administered 2012-12-20 – 2012-12-23 (×7): 2 via ORAL
  Administered 2012-12-23: 1 via ORAL
  Administered 2012-12-24: 2 via ORAL
  Filled 2012-12-20 (×5): qty 2
  Filled 2012-12-20: qty 1
  Filled 2012-12-20 (×3): qty 2

## 2012-12-20 MED ORDER — OSELTAMIVIR PHOSPHATE 75 MG PO CAPS
75.0000 mg | ORAL_CAPSULE | Freq: Two times a day (BID) | ORAL | Status: DC
  Administered 2012-12-20 (×2): 75 mg via ORAL
  Filled 2012-12-20 (×3): qty 1

## 2012-12-20 MED ORDER — BIOTENE DRY MOUTH MT LIQD
15.0000 mL | Freq: Two times a day (BID) | OROMUCOSAL | Status: DC
  Administered 2012-12-20 – 2012-12-24 (×7): 15 mL via OROMUCOSAL

## 2012-12-20 MED ORDER — DOCUSATE SODIUM 100 MG PO CAPS
100.0000 mg | ORAL_CAPSULE | Freq: Two times a day (BID) | ORAL | Status: DC
  Administered 2012-12-20 – 2012-12-24 (×9): 100 mg via ORAL
  Filled 2012-12-20 (×11): qty 1

## 2012-12-20 MED ORDER — PANTOPRAZOLE SODIUM 40 MG PO TBEC
40.0000 mg | DELAYED_RELEASE_TABLET | Freq: Every day | ORAL | Status: DC
  Administered 2012-12-20 – 2012-12-24 (×5): 40 mg via ORAL
  Filled 2012-12-20 (×5): qty 1

## 2012-12-20 MED ORDER — ENOXAPARIN SODIUM 30 MG/0.3ML ~~LOC~~ SOLN
30.0000 mg | SUBCUTANEOUS | Status: DC
  Administered 2012-12-20 – 2012-12-24 (×5): 30 mg via SUBCUTANEOUS
  Filled 2012-12-20 (×5): qty 0.3

## 2012-12-20 MED ORDER — DIAZEPAM 2 MG PO TABS: 2.0000 mg | ORAL_TABLET | Freq: Four times a day (QID) | ORAL | Status: DC | PRN

## 2012-12-20 MED ORDER — POLYSACCHARIDE IRON COMPLEX 150 MG PO CAPS
150.0000 mg | ORAL_CAPSULE | Freq: Every day | ORAL | Status: DC
  Administered 2012-12-21 – 2012-12-24 (×4): 150 mg via ORAL
  Filled 2012-12-20 (×5): qty 1

## 2012-12-20 MED ORDER — HYDROCHLOROTHIAZIDE 12.5 MG PO CAPS
12.5000 mg | ORAL_CAPSULE | Freq: Every day | ORAL | Status: DC
  Filled 2012-12-20: qty 1

## 2012-12-20 MED ORDER — DEXTROSE 5 % IV SOLN
500.0000 mg | INTRAVENOUS | Status: DC
  Administered 2012-12-21: 01:00:00 500 mg via INTRAVENOUS
  Filled 2012-12-20: qty 500

## 2012-12-20 MED ORDER — POTASSIUM CHLORIDE IN NACL 20-0.9 MEQ/L-% IV SOLN
INTRAVENOUS | Status: DC
  Administered 2012-12-20 – 2012-12-22 (×5): via INTRAVENOUS
  Filled 2012-12-20 (×5): qty 1000

## 2012-12-20 MED ORDER — FENTANYL 50 MCG/HR TD PT72
50.0000 ug | MEDICATED_PATCH | TRANSDERMAL | Status: DC
  Administered 2012-12-20 – 2012-12-23 (×2): 50 ug via TRANSDERMAL
  Filled 2012-12-20 (×2): qty 1

## 2012-12-20 MED ORDER — GABAPENTIN 100 MG PO CAPS
100.0000 mg | ORAL_CAPSULE | Freq: Two times a day (BID) | ORAL | Status: DC
  Administered 2012-12-20 – 2012-12-24 (×9): 100 mg via ORAL
  Filled 2012-12-20 (×10): qty 1

## 2012-12-20 MED ORDER — ALBUTEROL SULFATE (5 MG/ML) 0.5% IN NEBU
2.5000 mg | INHALATION_SOLUTION | RESPIRATORY_TRACT | Status: DC
  Administered 2012-12-20: 10:00:00 2.5 mg via RESPIRATORY_TRACT
  Filled 2012-12-20: qty 0.5

## 2012-12-20 MED ORDER — POLYETHYLENE GLYCOL 3350 17 G PO PACK
17.0000 g | PACK | Freq: Two times a day (BID) | ORAL | Status: DC
  Administered 2012-12-20 – 2012-12-24 (×6): 17 g via ORAL
  Filled 2012-12-20 (×10): qty 1

## 2012-12-20 MED ORDER — LEVOTHYROXINE SODIUM 125 MCG PO TABS
125.0000 ug | ORAL_TABLET | Freq: Every day | ORAL | Status: DC
  Administered 2012-12-20 – 2012-12-24 (×5): 125 ug via ORAL
  Filled 2012-12-20 (×6): qty 1

## 2012-12-20 MED ORDER — CYCLOSPORINE 0.05 % OP EMUL
1.0000 [drp] | Freq: Two times a day (BID) | OPHTHALMIC | Status: DC
  Administered 2012-12-20 – 2012-12-24 (×9): 1 [drp] via OPHTHALMIC
  Filled 2012-12-20 (×10): qty 1

## 2012-12-20 MED ORDER — PROMETHAZINE HCL 25 MG PO TABS: 12.5000 mg | ORAL_TABLET | Freq: Four times a day (QID) | ORAL | Status: DC | PRN

## 2012-12-20 MED ORDER — ACETAMINOPHEN 325 MG PO TABS
650.0000 mg | ORAL_TABLET | Freq: Four times a day (QID) | ORAL | Status: DC | PRN
  Administered 2012-12-23: 650 mg via ORAL
  Filled 2012-12-20: qty 2

## 2012-12-20 MED ORDER — TRAZODONE HCL 50 MG PO TABS
50.0000 mg | ORAL_TABLET | Freq: Every day | ORAL | Status: DC
  Administered 2012-12-20 – 2012-12-23 (×4): 50 mg via ORAL
  Filled 2012-12-20 (×5): qty 1

## 2012-12-20 MED ORDER — BENAZEPRIL HCL 20 MG PO TABS
20.0000 mg | ORAL_TABLET | Freq: Every day | ORAL | Status: DC
  Administered 2012-12-20 – 2012-12-21 (×2): 20 mg via ORAL
  Filled 2012-12-20 (×2): qty 1

## 2012-12-20 MED ORDER — CEFTRIAXONE SODIUM 1 G IJ SOLR
INTRAMUSCULAR | Status: AC
  Filled 2012-12-20: qty 10

## 2012-12-20 MED ORDER — SODIUM CHLORIDE 0.9 % IV BOLUS (SEPSIS)
250.0000 mL | Freq: Once | INTRAVENOUS | Status: AC
  Administered 2012-12-20: 250 mL via INTRAVENOUS

## 2012-12-20 MED ORDER — SENNOSIDES-DOCUSATE SODIUM 8.6-50 MG PO TABS: 1.0000 | ORAL_TABLET | Freq: Every evening | ORAL | Status: DC | PRN

## 2012-12-20 MED ORDER — ALBUTEROL SULFATE (5 MG/ML) 0.5% IN NEBU
2.5000 mg | INHALATION_SOLUTION | Freq: Three times a day (TID) | RESPIRATORY_TRACT | Status: DC
  Administered 2012-12-20 – 2012-12-22 (×6): 2.5 mg via RESPIRATORY_TRACT
  Filled 2012-12-20 (×6): qty 0.5

## 2012-12-20 MED ORDER — ALUM & MAG HYDROXIDE-SIMETH 200-200-20 MG/5ML PO SUSP
30.0000 mL | Freq: Four times a day (QID) | ORAL | Status: DC | PRN
  Administered 2012-12-21: 30 mL via ORAL
  Filled 2012-12-20: qty 30

## 2012-12-20 MED ORDER — BENAZEPRIL-HYDROCHLOROTHIAZIDE 20-12.5 MG PO TABS: 1.0000 | ORAL_TABLET | Freq: Every morning | ORAL | Status: DC

## 2012-12-20 MED ORDER — ACETAMINOPHEN 650 MG RE SUPP: 650.0000 mg | Freq: Four times a day (QID) | RECTAL | Status: DC | PRN

## 2012-12-20 MED ORDER — IPRATROPIUM BROMIDE 0.02 % IN SOLN
0.5000 mg | Freq: Three times a day (TID) | RESPIRATORY_TRACT | Status: DC
  Administered 2012-12-20 – 2012-12-22 (×6): 0.5 mg via RESPIRATORY_TRACT
  Filled 2012-12-20 (×6): qty 2.5

## 2012-12-20 MED ORDER — IPRATROPIUM BROMIDE 0.02 % IN SOLN
0.5000 mg | RESPIRATORY_TRACT | Status: DC
  Administered 2012-12-20: 10:00:00 0.5 mg via RESPIRATORY_TRACT
  Filled 2012-12-20: qty 2.5

## 2012-12-20 NOTE — Care Management Note (Signed)
    Page 1 of 1   12/25/2012     2:11:19 PM   CARE MANAGEMENT NOTE 12/25/2012  Patient:  Meghan Sexton, Meghan Sexton   Account Number:  [id number]  Date Initiated:  12/20/2012  Documentation initiated by:  Letha Cape  Subjective/Objective Assessment:   dx influenza  admit- lives with grand daughter     Action/Plan:   pt eval- no pt f/u needed.   Anticipated DC Date:  12/24/2012   Anticipated DC Plan:  HOME/SELF CARE      DC Planning Services  CM consult      Choice offered to / List presented to:             Status of service:  Completed, signed off Medicare Important Message given?   (If response is "NO", the following Medicare IM given date fields will be blank) Date Medicare IM given:   Date Additional Medicare IM given:    Discharge Disposition:  HOME/SELF CARE  Per UR Regulation:  Reviewed for med. necessity/level of care/duration of stay  If discussed at Long Length of Stay Meetings, dates discussed:    Comments:  12/25/12 14:10 Letha Cape RN, BSN [phone] patient dc to home, no NCM referral, no needs anticipated.  12/20/12 16:40 Letha Cape RN, BSN [phone] patient lives with grand daughter, per physical therapy patient has no pt needs.  NCM will continue to follow for dc needs.

## 2012-12-20 NOTE — H&P (Signed)
Meghan Sexton is an 79 y.o. female.   PCP:   Gwen Pounds, MD   Chief Complaint:  Cough, fever, chills rigors, Leukocytosis.  HPI: [name] c numerous issues.  She saw me prior to Thanksgiving c Viral URI.  She reports she never really got better and stayed weak, anorexic and AFTT.  She worsened yesterday c fever, chills, Rigors, SOB, Wet productive (yellow-white sputum) cough, HA, Myalgias, Nausea and congestion.  Pt denies diarrhea, any urinary symptoms, CP and vomiting though she has had a loose stool yesterday.  [facility].  WBC 26+ c rest of CMET/CBC fine, Lactate up, Temp > 100.5, Sats fine but due to Sxs FIO2 started, UA unremarkable, CXR unremarkable.  Started on Rocephin/Azithro for clinical PNA.  Influenza PCR ordered c empiric Tamiflu.  Fluids given.  Admitted for eval and Rx.  She is globally weak.  She is coughing continuously.  She looks ill.     Past Medical History:  Past Medical History  Diagnosis Date  . Hypertension   . Asthma   . Hypothyroidism   . Constipation   . Blood transfusion 2010     1 unit after each back surgery  . Sleep apnea     stopbang=4  . Anxiety   . Depression   . GERD (gastroesophageal reflux disease)   . Arthritis    Severe Chronic LBP,  HTN,   Hypothyroidism,   Hyperlipidemia,   Chronic LE Edema,   Osteoporosis on q 3 month IV Boniva,   Osteoarthritis,  Severe Chronic Pain,  GERD/HH/Chronic Gastritis/Nissen Fundiplication 01/09/04, Esophageal Strictures & Dysphagia  S/P dilatations,   T11-S1 (screw) 05/27/05 with ORIF,   Severe Scolosis,   H/O Melanoma,   FHH (PTHi 90.7, 24 hr Ca 63.4, Calcium 10.7),   Constipation,  Tremors, Benign Breast Lump,  Depression,  Gait D/O Uses hand brake Rolling Walker.       Past Surgical History  Procedure Laterality Date  . Whole back surgery for scioliosis   rods inserted ,with last surgery 2010    surgery done x 3 times--05/27/2005,05/2009,10/19/2009  . Right rotator cuff  repair  09/07/1993  . Malignant melanoa removed from right shoulder   73yrs ago  . Melignant melanoma removed from near left shin  6 yrs ago  . Bunion removed from left foot  50 yrs ago  . Lens implants both eyes  15 yrs ago  . Total knee arthroplasty  04/04/2011    Procedure: TOTAL KNEE ARTHROPLASTY;  Surgeon: [name], MD;  Location: [facility];  Service: Orthopedics;  Laterality: Left;  . Tonsillectomy  1936  . Appendectomy  1956    with suspension of uterus  . Tubal ligation  yrs ago    85  . Hernia repair  yrs ago    1958  . Cholecystectomy  yrs ago    35  . Eye surgery  12/1995,01/1996    bilateral cararacts with lens implants  . Hiatal hernia repair  01/09/2004    large paraesophageal  . Abdominal hysterectomy  1964- age 63    partial  . Back surgery  05/27/2005    for lumbar scoliotic deformity  . Breast surgery  2010    right breast-benign  . Anterior /posterior rectocele  07/12/1991    and ovarian cystectomy  . Total hip arthroplasty Left 10/23/2012    Procedure: LEFT TOTAL HIP ARTHROPLASTY ANTERIOR APPROACH;  Surgeon: [name], MD;  Location: [facility];  Service: Orthopedics;  Laterality: Left;   L THR Anterior approach Dr Charlann Boxer 10/26/12 repeat Extensive Surgery T-L Spine - 05/2012 Dr Wyline Mood. extensive surgery 10/19/09 With Rods. T 4-10; T 10-S1.  T11-S1 (screw) 05/27/05 with ORIF.  T10/T11 Compression Fx/L5 Screw Fx S/P Decompression/laminectomy 06/05/09 R TKR 04/04/11 Dr Charlann Boxer. Nissen Fundiplication 01/09/04 T&A 36 S/P TAH 1956 Suspension of uterus & APPY 1956 Tubal 59 S/P Cholecystectomy 89 Melanoma removed (R) Shoulder (R) inguinal hernia 60 Rotator cuff repair 60 (B) cataract 97 & 98   Allergies:   Allergies  Allergen Reactions  . Codeine Nausea And Vomiting  . Penicillins Other (See Comments)    Nodules in legs   . Sulfonamide Derivatives Nausea Only     Medications: Prior to Admission medications   Medication Sig Start Date End Date  Taking? Authorizing Provider  albuterol (PROVENTIL HFA;VENTOLIN HFA) 108 (90 BASE) MCG/ACT inhaler Inhale 1-2 puffs into the lungs every 6 (six) hours as needed for wheezing (Use q 6 hours for next 3 days then prn). 07/14/12   Riki Sheer, PA-C  benazepril-hydrochlorthiazide (LOTENSIN HCT) 20-12.5 MG per tablet Take 1 tablet by mouth every morning.    Historical Provider, MD  budesonide (PULMICORT) 180 MCG/ACT inhaler Inhale 2 puffs into the lungs at bedtime.    Historical Provider, MD  CALCIUM & MAGNESIUM CARBONATES PO Take 1 tablet by mouth every morning.     Historical Provider, MD  cycloSPORINE (RESTASIS) 0.05 % ophthalmic emulsion Place 1 drop into both eyes every 12 (twelve) hours.    Historical Provider, MD  diazepam (VALIUM) 2 MG tablet Take 2 mg by mouth every 6 (six) hours as needed. For anxiety    Historical Provider, MD  docusate sodium 100 MG CAPS Take 100 mg by mouth 2 (two) times daily. 10/25/12   Genelle Gather Babish, PA-C  fentaNYL (DURAGESIC - DOSED MCG/HR) 50 MCG/HR Place 1 patch onto the skin every 3 (three) days.    Historical Provider, MD  ferrous sulfate 325 (65 FE) MG tablet Take 1 tablet (325 mg total) by mouth 3 (three) times daily after meals. 04/06/11 04/05/12  Genelle Gather Babish, PA-C  ferrous sulfate 325 (65 FE) MG tablet Take 1 tablet (325 mg total) by mouth 3 (three) times daily after meals. 10/25/12   Genelle Gather Babish, PA-C  furosemide (LASIX) 40 MG tablet Take 40 mg by mouth daily as needed for edema.    Historical Provider, MD  gabapentin (NEURONTIN) 100 MG capsule Take 100 mg by mouth 2 (two) times daily.    Historical Provider, MD  hydrochlorothiazide (HYDRODIURIL) 12.5 MG tablet Take 12.5 mg by mouth every morning.    Historical Provider, MD  HYDROcodone-acetaminophen (NORCO) 7.5-325 MG per tablet Take 1-2 tablets by mouth every 4 (four) hours. 10/25/12   Genelle Gather Babish, PA-C  hyoscyamine (ANASPAZ) 0.125 MG TBDP Place 0.125 mg under the tongue every 4  (four) hours as needed. May take 1 to 2 tablets. For stomach cramps.    Historical Provider, MD  ipratropium (ATROVENT) 0.06 % nasal spray Place 2 sprays into the nose every morning.    Historical Provider, MD  levothyroxine (SYNTHROID, LEVOTHROID) 125 MCG tablet Take 125 mcg by mouth every morning.    Historical Provider, MD  methocarbamol (ROBAXIN) 500 MG tablet Take 1 tablet (500 mg total) by mouth every 6 (six) hours as needed (muscle spasms). 10/25/12   Genelle Gather Babish, PA-C  Multiple Vitamin (MULITIVITAMIN WITH MINERALS) TABS Take 1 tablet by mouth every morning.  Historical Provider, MD  pantoprazole (PROTONIX) 40 MG tablet Take 40 mg by mouth daily.     Historical Provider, MD  polyethylene glycol (MIRALAX / GLYCOLAX) packet Take 17 g by mouth 2 (two) times daily. 10/25/12   Genelle Gather Babish, PA-C  quiNINE Janith Lima) 324 MG capsule Take 648 mg by mouth every 8 (eight) hours as needed. For muscle cramps in legs    Historical Provider, MD  traZODone (DESYREL) 50 MG tablet Take 50 mg by mouth at bedtime.    Historical Provider, MD  vitamin C (ASCORBIC ACID) 250 MG tablet Take 1,000 mg by mouth every morning.    Historical Provider, MD     Medications Prior to Admission  Medication Sig Dispense Refill  . albuterol (PROVENTIL HFA;VENTOLIN HFA) 108 (90 BASE) MCG/ACT inhaler Inhale 1-2 puffs into the lungs every 6 (six) hours as needed for wheezing (Use q 6 hours for next 3 days then prn).  1 Inhaler  0  . benazepril-hydrochlorthiazide (LOTENSIN HCT) 20-12.5 MG per tablet Take 1 tablet by mouth every morning.      . budesonide (PULMICORT) 180 MCG/ACT inhaler Inhale 2 puffs into the lungs at bedtime.      Marland Kitchen CALCIUM & MAGNESIUM CARBONATES PO Take 1 tablet by mouth every morning.       . cycloSPORINE (RESTASIS) 0.05 % ophthalmic emulsion Place 1 drop into both eyes every 12 (twelve) hours.      . diazepam (VALIUM) 2 MG tablet Take 2 mg by mouth every 6 (six) hours as needed. For anxiety       . docusate sodium 100 MG CAPS Take 100 mg by mouth 2 (two) times daily.  10 capsule  0  . fentaNYL (DURAGESIC - DOSED MCG/HR) 50 MCG/HR Place 1 patch onto the skin every 3 (three) days.      . ferrous sulfate 325 (65 FE) MG tablet Take 1 tablet (325 mg total) by mouth 3 (three) times daily after meals.      . ferrous sulfate 325 (65 FE) MG tablet Take 1 tablet (325 mg total) by mouth 3 (three) times daily after meals.    3  . furosemide (LASIX) 40 MG tablet Take 40 mg by mouth daily as needed for edema.      . gabapentin (NEURONTIN) 100 MG capsule Take 100 mg by mouth 2 (two) times daily.      . hydrochlorothiazide (HYDRODIURIL) 12.5 MG tablet Take 12.5 mg by mouth every morning.      Marland Kitchen HYDROcodone-acetaminophen (NORCO) 7.5-325 MG per tablet Take 1-2 tablets by mouth every 4 (four) hours.  30 tablet  0  . hyoscyamine (ANASPAZ) 0.125 MG TBDP Place 0.125 mg under the tongue every 4 (four) hours as needed. May take 1 to 2 tablets. For stomach cramps.      Marland Kitchen ipratropium (ATROVENT) 0.06 % nasal spray Place 2 sprays into the nose every morning.      Marland Kitchen levothyroxine (SYNTHROID, LEVOTHROID) 125 MCG tablet Take 125 mcg by mouth every morning.      . methocarbamol (ROBAXIN) 500 MG tablet Take 1 tablet (500 mg total) by mouth every 6 (six) hours as needed (muscle spasms).  50 tablet  0  . Multiple Vitamin (MULITIVITAMIN WITH MINERALS) TABS Take 1 tablet by mouth every morning.      . pantoprazole (PROTONIX) 40 MG tablet Take 40 mg by mouth daily.       . polyethylene glycol (MIRALAX / GLYCOLAX) packet Take 17 g by mouth 2 (  two) times daily.  14 each  0  . quiNINE (QUALAQUIN) 324 MG capsule Take 648 mg by mouth every 8 (eight) hours as needed. For muscle cramps in legs      . traZODone (DESYREL) 50 MG tablet Take 50 mg by mouth at bedtime.      . vitamin C (ASCORBIC ACID) 250 MG tablet Take 1,000 mg by mouth every morning.       1)  Ondansetron 4 Mg Tbdp (Ondansetron) .... One po q 8 hrs prn 2)   Benazepril-hydrochlorothiazide 20-12.5 Mg Tabs (Benazepril-hydrochlorothiazide) .Marland Kitchen.. 1 po qd 3)  Hydrochlorothiazide 12.5 Mg Tabs (Hydrochlorothiazide) .... Take one tablet once daily 4)  Synthroid 125 Mcg Tabs (Levothyroxine sodium) .Marland Kitchen.. 1 tab daily. 5)  Trazodone Hcl 50 Mg Tabs (Trazodone hcl) .Marland Kitchen.. 1 tab daily 6)  Pulmicort Flexhaler 180 Mcg/act Aepb (Budesonide) .... 2 puffs per day 7)  Atrovent 0.06 % Soln (Ipratropium bromide) .... One time daily in am 8)  Diazepam 2 Mg Tabs (Diazepam) .... 1/2 tab in am and 1 tab in hs prn 9)  Furosemide 40 Mg Tabs (Furosemide) .... 1/2 tab prn 10)  Mobic 15 Mg Tabs (Meloxicam) .... Take one tablet by mouth once a day as needed 11)  Methocarbamol 500 Mg Tabs (Methocarbamol) .Marland Kitchen.. 1 po bid 12)  Aspirin 81 Mg Ec Tab (Aspirin) .... Take one (1) tablet by mouth daily 13)  Miralax Pack (Polyethylene glycol 3350) .... Powder in 8 ounces of water and drink once daily. 14)  Gabapentin 100 Mg Caps (Gabapentin) .... One two times daily 15)  Duragesic-50 50 Mcg/hr Pt72 (Fentanyl) .Marland Kitchen.. 1 patch every 3 days 16)  Hyomax-sl 0.125 Mg Subl (Hyoscyamine sulfate) .... Dissolve 1 tablet under the tongue as needed 17)  Qualaquin 324 Mg Caps (Quinine sulfate) .Marland Kitchen.. 1 qhs prn 18)  Protonix 40 Mg Tbec (Pantoprazole sodium) .Marland Kitchen.. 1 tab daily 19)  Restasis 0.05 % Emul (Cyclosporine) 20)  Multivitamins Tabs (Multiple vitamin) .... Take one tablet by mouth every day 21)  Calcium 600 Mg Tabs (Calcium) .... Take one tablet by mouth twice daily 22)  Vitamin D 1000 Unit Caps (Cholecalciferol) .... Take two capsules by mouth every day 23)  C-1000 Sr Cr-tabs (Ascorbic acid cr-tabs) .Marland Kitchen.. 1 tablet by mouth once daily 24)  Boniva 3 Mg/43ml Soln (Ibandronate sodium) .Marland Kitchen.. 1q62m 25)  Hydrocodone-acetaminophen 5-325 Mg Tabs (Hydrocodone-acetaminophen) .Marland Kitchen.. 1 to 2 po every 6 hours prn pain (max of 8 pills per day)     Social History:  reports that she has never smoked. She has never used  smokeless tobacco. She reports that she does not drink alcohol or use illicit drugs.  Widowed x 9 years with 4 children and 5 grandchildren Occupation: Retired from [employer] No Tobacco. No Alcohol. Patient since 31  Family History: History reviewed. No pertinent family history. Mother: S/P Subtotal thryoidectomy. CVA Siblings: Sister-died MI, fall, broke, leg. Sick with severe OA, back pain Children: Daughter: died 43: Breast cancer; son died 42 renal cell cancer  Review of Systems:  Review of Systems - See HPI.  Full ROS Obtained. No CP, No current Bowel or Bladder issues. Feeling better already. Globally weak.   Physical Exam:  Blood pressure 118/65, pulse 73, temperature 98.3 F (36.8 C), temperature source Oral, resp. rate 20, height 4\' 10"  (1.473 m), weight 58.151 kg (128 lb 3.2 oz), SpO2 98.00%. Filed Vitals:   12/19/12 2342 12/20/12 0047 12/20/12 0157 12/20/12 0518  BP: 125/55 121/54 117/63 118/65  Pulse:  102 94 89 73  Temp: 98.3 F (36.8 C) 99.3 F (37.4 C) 98.2 F (36.8 C) 98.3 F (36.8 C)  TempSrc: Oral Oral Oral Oral  Resp: 18 18 18 20   Height:   4\' 10"  (1.473 m)   Weight:   58.151 kg (128 lb 3.2 oz)   SpO2: 95% 97% 94% 98%   General appearance: Alert and O Head: Normocephalic, without obvious abnormality, atraumatic Eyes: conjunctivae/corneas clear. PERRL, EOM's intact.  Nose: Nares normal. Septum midline. Mucosa normal. No drainage or sinus tenderness. OP - Dry Neck: no adenopathy, no carotid bruit, no JVD and thyroid not enlarged, symmetric, no tenderness/mass/nodules Resp: Wet thick cough c Rhonchi. Cardio: Reg GI: soft, non-tender; bowel sounds normal; no masses,  no organomegaly Extremities: extremities normal, atraumatic, no cyanosis or edema Pulses: 2+ and symmetric Lymph nodes: Cervical adenopathy: no cervical lymphadenopathy Neurologic: Alert and oriented X 3, normal strength and tone. Normal symmetric reflexes.     Labs on Admission:    Recent Labs  12/19/12 2200  NA 133*  K 4.0  CL 92*  CO2 27  GLUCOSE 118*  BUN 16  CREATININE 0.70  CALCIUM 10.5    Recent Labs  12/19/12 2200  AST 19  ALT 12  ALKPHOS 96  BILITOT 0.4  PROT 6.9  ALBUMIN 4.0   No results found for this basename: LIPASE, AMYLASE,  in the last 72 hours  Recent Labs  12/19/12 2200  WBC 26.7*  NEUTROABS 24.3*  HGB 12.7  HCT 38.2  MCV 94.1  PLT 271   No results found for this basename: CKTOTAL, CKMB, CKMBINDEX, TROPONINI,  in the last 72 hours Lab Results  Component Value Date   INR 0.94 10/15/2012   INR 0.86 03/28/2011     LAB RESULT POCT:  Results for orders placed during the hospital encounter of 12/19/12  CBC WITH DIFFERENTIAL      Result Value Range   WBC 26.7 (*) 4.0 - 10.5 K/uL   RBC 4.06  3.87 - 5.11 MIL/uL   Hemoglobin 12.7  12.0 - 15.0 g/dL   HCT 96.0  45.4 - 09.8 %   MCV 94.1  78.0 - 100.0 fL   MCH 31.3  26.0 - 34.0 pg   MCHC 33.2  30.0 - 36.0 g/dL   RDW 11.9  14.7 - 82.9 %   Platelets 271  150 - 400 K/uL   Neutrophils Relative % 91 (*) 43 - 77 %   Lymphocytes Relative 3 (*) 12 - 46 %   Monocytes Relative 6  3 - 12 %   Eosinophils Relative 0  0 - 5 %   Basophils Relative 0  0 - 1 %   Neutro Abs 24.3 (*) 1.7 - 7.7 K/uL   Lymphs Abs 0.8  0.7 - 4.0 K/uL   Monocytes Absolute 1.6 (*) 0.1 - 1.0 K/uL   Eosinophils Absolute 0.0  0.0 - 0.7 K/uL   Basophils Absolute 0.0  0.0 - 0.1 K/uL   RBC Morphology TEARDROP CELLS    COMPREHENSIVE METABOLIC PANEL      Result Value Range   Sodium 133 (*) 135 - 145 mEq/L   Potassium 4.0  3.5 - 5.1 mEq/L   Chloride 92 (*) 96 - 112 mEq/L   CO2 27  19 - 32 mEq/L   Glucose, Bld 118 (*) 70 - 99 mg/dL   BUN 16  6 - 23 mg/dL   Creatinine, Ser 5.62  0.50 - 1.10 mg/dL   Calcium  10.5  8.4 - 10.5 mg/dL   Total Protein 6.9  6.0 - 8.3 g/dL   Albumin 4.0  3.5 - 5.2 g/dL   AST 19  0 - 37 U/L   ALT 12  0 - 35 U/L   Alkaline Phosphatase 96  39 - 117 U/L   Total Bilirubin 0.4  0.3 - 1.2  mg/dL   GFR calc non Af Amer 76 (*) >90 mL/min   GFR calc Af Amer 88 (*) >90 mL/min  URINALYSIS, ROUTINE W REFLEX MICROSCOPIC      Result Value Range   Color, Urine YELLOW  YELLOW   APPearance CLEAR  CLEAR   Specific Gravity, Urine 1.015  1.005 - 1.030   pH 5.5  5.0 - 8.0   Glucose, UA NEGATIVE  NEGATIVE mg/dL   Hgb urine dipstick NEGATIVE  NEGATIVE   Bilirubin Urine NEGATIVE  NEGATIVE   Ketones, ur NEGATIVE  NEGATIVE mg/dL   Protein, ur NEGATIVE  NEGATIVE mg/dL   Urobilinogen, UA 1.0  0.0 - 1.0 mg/dL   Nitrite NEGATIVE  NEGATIVE   Leukocytes, UA SMALL (*) NEGATIVE  URINE MICROSCOPIC-ADD ON      Result Value Range   Squamous Epithelial / LPF RARE  RARE   WBC, UA 7-10  <3 WBC/hpf   RBC / HPF 0-2  <3 RBC/hpf  CG4 I-STAT (LACTIC ACID)      Result Value Range   Lactic Acid, Venous 2.78 (*) 0.5 - 2.2 mmol/L      Radiological Exams on Admission: Dg Chest 2 View  12/19/2012   CLINICAL DATA:  Cough, chills.  History of hypertension and asthma.  EXAM: CHEST  2 VIEW  COMPARISON:  10/15/2012  FINDINGS: Patient has had previous posterior spinal fusion. Heart size is normal. There are no focal consolidations or pleural effusions. No pulmonary edema. Previous ligamentous repair of the right shoulder.  IMPRESSION: No evidence for acute cardiopulmonary abnormality.   Electronically Signed   By: Rosalie Gums M.D.   On: 12/19/2012 22:36      Orders placed during the hospital encounter of 10/23/12  . EKG     Assessment/Plan Principal Problem:   Influenza Active Problems:   HYPERTENSION   COPD   Leukocytosis, unspecified  Clinical PNA - FIO2, Pulm Toilet, Abx. F/Up CXR  Fever/chills/Rigors c Leukocytosis - No Sepsis seen.  F/Up Blood and U Cxs.  Doubt any hardware infection due to lack of localizing Sxs.  F/Up Lbas and CXR.  Continue Fluids.  I think flu will be (-).  Droplet isolation until it is.  Check Sed rate.,   General Weakness and AFTT in a pt c numerous Ortho surgeries and  procedures - most recently 10/2012 Ant THR per Dr Charlann Boxer.  Lots of back surgeries.  Walks c Product/process development scientist.- PT/OT/CW  Anorexic due to illness - Nutrition consult - Provide Boost/Ensure.  Recent Viral Gastroenteritis - Other family members had the same thing.  Sxs Rx for any Nausea/Vom/Diarrhea.  Chronic pain c Severe Chronic LBP - Continue Fentanyl and mult meds  HTN - controlled - Hold Diuretics while getting fluids.  Hypothyroidism - On Synthroid.  Osteoporosis on q26m Boniva.  GERD/HH/Chronic Gastritis/Nissen Fundiplication 01/09/04  DVT Proph - Lovenox.  Anticipate 2-3 days in hospital depending on function - May need Short SNF on D/c.  Develle Sievers M 12/20/2012, 7:17 AM

## 2012-12-20 NOTE — Evaluation (Signed)
Physical Therapy Evaluation Patient Details Name: Meghan Sexton MRN: [id number] DOB: [birth year] Today's Date: 12/20/2012 Time: 1030-1046 PT Time Calculation (min): 16 min  PT Assessment / Plan / Recommendation History of Present Illness  59 F c numerous issues.  She saw me prior to Thanksgiving c Viral URI.  She reports she never really got better and stayed weak, anorexic and AFTT.  She worsened yesterday c fever, chills, Rigors, SOB, Wet productive (yellow-white sputum) cough, HA, Myalgias, Nausea and congestion.  Pt denies diarrhea, any urinary symptoms, CP and vomiting though she has had a loose stool yesterday.  [facility] and [facility].  WBC 26+ c rest of CMET/CBC fine, Lactate up, Temp > 100.5, Sats fine but due to Sxs FIO2 started, UA unremarkable, CXR unremarkable.  Started on Rocephin/Azithro for clinical PNA.  Influenza PCR ordered c empiric Tamiflu.  Fluids given.  Admitted for eval and Rx.  She is globally weak.  She is coughing continuously.  She looks ill.  Clinical Impression  Pt presents with generalized weakness but appears at baseline level of functioning.  Pt states she will have 24/7 supervision from granddaughter at d/c home.  Pt with no further PT needs at this time.    PT Assessment  Patent does not need any further PT services    Follow Up Recommendations  No PT follow up    Does the patient have the potential to tolerate intense rehabilitation      Barriers to Discharge        Equipment Recommendations       Recommendations for Other Services     Frequency      Precautions / Restrictions Precautions Precautions: None Restrictions Weight Bearing Restrictions: No   Pertinent Vitals/Pain no c/o pain      Mobility  Bed Mobility Bed Mobility: Supine to Sit;Sit to Supine Supine to Sit: 5: Supervision Sit to Supine: 5: Supervision Transfers Transfers: Sit to Stand;Stand to Sit Sit to Stand: 5: Supervision Stand to Sit: 5:  Supervision Ambulation/Gait Ambulation/Gait Assistance: 5: Supervision Ambulation Distance (Feet): 100 Feet Assistive device: Rolling walker Ambulation/Gait Assistance Details: no LOB or c/o SOB    Exercises     PT Diagnosis:    PT Problem List:   PT Treatment Interventions:       PT Goals(Current goals can be found in the care plan section)    Visit Information  Last PT Received On: 12/20/12 Assistance Needed: +1 History of Present Illness: 28 F c numerous issues.  She saw me prior to Thanksgiving c Viral URI.  She reports she never really got better and stayed weak, anorexic and AFTT.  She worsened yesterday c fever, chills, Rigors, SOB, Wet productive (yellow-white sputum) cough, HA, Myalgias, Nausea and congestion.  Pt denies diarrhea, any urinary symptoms, CP and vomiting though she has had a loose stool yesterday.  [facility] and [facility].  WBC 26+ c rest of CMET/CBC fine, Lactate up, Temp > 100.5, Sats fine but due to Sxs FIO2 started, UA unremarkable, CXR unremarkable.  Started on Rocephin/Azithro for clinical PNA.  Influenza PCR ordered c empiric Tamiflu.  Fluids given.  Admitted for eval and Rx.  She is globally weak.  She is coughing continuously.  She looks ill.       Prior Functioning  Home Living Family/patient expects to be discharged to:: Private residence Living Arrangements:  (currently lives with granddaughter) Available Help at Discharge: Family;Available 24 hours/day Type of Home:  House Home Access: Level entry Home Layout: One level Home Equipment: Environmental consultant - 2 wheels Prior Function Level of Independence: Independent with assistive device(s) Comments: uses RW Communication Communication: HOH    Cognition  Cognition Arousal/Alertness: Awake/alert Behavior During Therapy: WFL for tasks assessed/performed Overall Cognitive Status: Within Functional Limits for tasks assessed    Extremity/Trunk Assessment Lower Extremity Assessment Lower  Extremity Assessment: Generalized weakness   Balance Static Standing Balance Static Standing - Balance Support: During functional activity Static Standing - Level of Assistance: 5: Stand by assistance Static Standing - Comment/# of Minutes: pt able to perform toileting and hygiene with supervision without LOB  End of Session PT - End of Session Equipment Utilized During Treatment: Gait belt Activity Tolerance: Patient tolerated treatment well Patient left: in bed;with call bell/phone within reach;with nursing/sitter in room Nurse Communication: Mobility status  GP     Son Barkan 12/20/2012, 11:01 AM

## 2012-12-20 NOTE — Progress Notes (Signed)
INITIAL NUTRITION ASSESSMENT  DOCUMENTATION CODES Per approved criteria  -Not Applicable   INTERVENTION: 1.  Supplements; Ensure Complete po TID, each supplement provides 350 kcal and 13 grams of protein. 2.  General healthful diet; encourage intake of foods and beverages as able.  RD to follow and assess for nutritional adequacy.   NUTRITION DIAGNOSIS: Inadequate oral intake related to poor appetite, weakness as evidenced by pt report, wt loss.   Monitor:  1.  Food/Beverage; pt meeting >/=90% estimated needs with tolerance. 2.  Wt/wt change; monitor trends  Reason for Assessment: MST  80 y.o. female  Admitting Dx: Influenza  ASSESSMENT: Pt admitted with cough, fever, chills.   Pt s/p hip replacement 2 months ago.  Pt states she was eating well with good appetite prior to surgery.  Since surgery she has not been able to return to her normal baseline.   Pt states she has been eating less than her usual and has been supplementing with Ensure up to 3 times daily. She has been living in rehab and with her granddaughter where he has meals prepared for her.  She reports wt loss of 8 lbs in the past 2 months (5.8% in 2 months).  Nutrition Focused Physical Exam: Subcutaneous Fat:  Orbital Region: WNL Upper Arm Region: WNL Thoracic and Lumbar Region: WNL  Muscle:  Temple Region: WNL Clavicle Bone Region: WNL Clavicle and Acromion Bone Region: WNL Scapular Bone Region: mild wasting Dorsal Hand: moderate wasting Patellar Region: WNL Anterior Thigh Region: WNL Posterior Calf Region: WNL  Edema: none present  Pt has had poor intake over the past 2 months which she has been able to improve with Ensure several times daily.  She had had associated wt loss.  She states she lives at home alone, but recently has been staying with her granddaughter.  She has likely benefited from having meals prepared for her (rehab/granddaughter) and should be instructed to continue Ensure at least TID  after d/c. Pt does not meet criteria for malnutrition.  Height: Ht Readings from Last 1 Encounters:  12/20/12 4\' 10"  (1.473 m)    Weight: Wt Readings from Last 1 Encounters:  12/20/12 128 lb 3.2 oz (58.151 kg)    Ideal Body Weight: 95 lbs  % Ideal Body Weight: 134%  Wt Readings from Last 10 Encounters:  12/20/12 128 lb 3.2 oz (58.151 kg)  10/23/12 133 lb (60.328 kg)  10/23/12 133 lb (60.328 kg)  10/15/12 133 lb 12.8 oz (60.691 kg)  04/04/11 148 lb (67.132 kg)  04/04/11 148 lb (67.132 kg)  03/28/11 148 lb 14.4 oz (67.541 kg)  06/18/08 150 lb (68.04 kg)  04/29/08 149 lb 8 oz (67.813 kg)  03/12/08 151 lb 2.1 oz (68.553 kg)    Usual Body Weight: 140+ lbs  % Usual Body Weight: 91%  BMI:  Body mass index is 26.8 kg/(m^2).  Estimated Nutritional Needs: Kcal: 1650-1800 Protein: 70-80g Fluid: >1.8 L/day  Skin: intact  Diet Order: General  EDUCATION NEEDS: -Education needs addressed   Intake/Output Summary (Last 24 hours) at 12/20/12 1257 Last data filed at 12/20/12 0924  Gross per 24 hour  Intake 606.25 ml  Output    600 ml  Net   6.25 ml    Last BM: PTA   Labs:   Recent Labs Lab 12/19/12 2200 12/20/12 0636 12/20/12 0812  NA 133*  --  133*  K 4.0  --  4.2  CL 92*  --  97  CO2 27  --  27  BUN 16  --  13  CREATININE 0.70 0.70 0.62  CALCIUM 10.5  --  9.5  GLUCOSE 118*  --  115*    CBG (last 3)  No results found for this basename: GLUCAP,  in the last 72 hours  Scheduled Meds: . ipratropium  0.5 mg Nebulization TID   And  . albuterol  2.5 mg Nebulization TID  . antiseptic oral rinse  15 mL Mouth Rinse BID  . [START ON 12/21/2012] azithromycin  500 mg Intravenous Q24H  . benazepril  20 mg Oral Daily  . cefTRIAXone (ROCEPHIN)  IV  1 g Intravenous Q24H  . cycloSPORINE  1 drop Both Eyes Q12H  . docusate sodium  100 mg Oral BID  . enoxaparin (LOVENOX) injection  30 mg Subcutaneous Q24H  . fentaNYL  50 mcg Transdermal Q72H  . fluticasone  2  puff Inhalation BID  . gabapentin  100 mg Oral BID  . iron polysaccharides  150 mg Oral Daily  . levothyroxine  125 mcg Oral QAC breakfast  . oseltamivir  75 mg Oral BID  . pantoprazole  40 mg Oral Daily  . polyethylene glycol  17 g Oral BID  . traZODone  50 mg Oral QHS    Continuous Infusions: . 0.9 % NaCl with KCl 20 mEq / L 75 mL/hr at 12/20/12 9604    Past Medical History  Diagnosis Date  . Hypertension   . Asthma   . Hypothyroidism   . Constipation   . Blood transfusion 2010     1 unit after each back surgery  . Sleep apnea     stopbang=4  . Anxiety   . Depression   . GERD (gastroesophageal reflux disease)   . Arthritis     Past Surgical History  Procedure Laterality Date  . Whole back surgery for scioliosis   rods inserted ,with last surgery 2010    surgery done x 3 times--05/27/2005,05/2009,10/19/2009  . Right rotator cuff repair  09/07/1993  . Malignant melanoa removed from right shoulder   59yrs ago  . Melignant melanoma removed from near left shin  6 yrs ago  . Bunion removed from left foot  50 yrs ago  . Lens implants both eyes  15 yrs ago  . Total knee arthroplasty  04/04/2011    Procedure: TOTAL KNEE ARTHROPLASTY;  Surgeon: [name], MD;  Location: [facility];  Service: Orthopedics;  Laterality: Left;  . Tonsillectomy  1936  . Appendectomy  1956    with suspension of uterus  . Tubal ligation  yrs ago    39  . Hernia repair  yrs ago    1958  . Cholecystectomy  yrs ago    89  . Eye surgery  12/1995,01/1996    bilateral cararacts with lens implants  . Hiatal hernia repair  01/09/2004    large paraesophageal  . Abdominal hysterectomy  1964- age 18    partial  . Back surgery  05/27/2005    for lumbar scoliotic deformity  . Breast surgery  2010    right breast-benign  . Anterior /posterior rectocele  07/12/1991    and ovarian cystectomy  . Total hip arthroplasty Left 10/23/2012    Procedure: LEFT TOTAL HIP ARTHROPLASTY ANTERIOR APPROACH;   Surgeon: [name], MD;  Location: [facility];  Service: Orthopedics;  Laterality: Left;    Loyce Dys, MS RD LDN Clinical Inpatient Dietitian Pager: [phone] Weekend/After hours pager: [phone]

## 2012-12-20 NOTE — Progress Notes (Signed)
Patient admitted to 5w36 from [facility] via [facility]. Patient is currently living with granddaughter. Patient's sacrum is slightly red but blanchable. Patient has fentanyl patch to left upper chest that was placed at home. Patient is A&Ox4 and hard of hearing. Patient has personal cell phone as bedside. Patient is on 2L of O2 via nasal cannula.  Notified Dr. Eloise Harman of patient's admission to unit. Patient and granddaughter oriented to unit and room. Told patient to call for assistance before getting out of bed and placed patient on bed alarm. Will continue to monitor patient. Nelda Marseille, RN

## 2012-12-21 LAB — BASIC METABOLIC PANEL WITH GFR
BUN: 13 mg/dL (ref 6–23)
CO2: 26 meq/L (ref 19–32)
Calcium: 9.3 mg/dL (ref 8.4–10.5)
Chloride: 101 meq/L (ref 96–112)
Creatinine, Ser: 0.63 mg/dL (ref 0.50–1.10)
GFR calc Af Amer: >90 mL/min
GFR calc non Af Amer: 78 mL/min — ABNORMAL LOW
Glucose, Bld: 110 mg/dL — ABNORMAL HIGH (ref 70–99)
Potassium: 5.3 meq/L — ABNORMAL HIGH (ref 3.5–5.1)
Sodium: 134 meq/L — ABNORMAL LOW (ref 135–145)

## 2012-12-21 LAB — URINE CULTURE
Colony Count: NO GROWTH
Culture: NO GROWTH

## 2012-12-21 LAB — CBC
HCT: 30.3 % — ABNORMAL LOW (ref 36.0–46.0)
Hemoglobin: 10.4 g/dL — ABNORMAL LOW (ref 12.0–15.0)
MCH: 32.3 pg (ref 26.0–34.0)
MCHC: 34.3 g/dL (ref 30.0–36.0)
MCV: 94.1 fL (ref 78.0–100.0)
RBC: 3.22 MIL/uL — ABNORMAL LOW (ref 3.87–5.11)

## 2012-12-21 MED ORDER — AZITHROMYCIN 500 MG PO TABS
500.0000 mg | ORAL_TABLET | Freq: Every day | ORAL | Status: AC
  Administered 2012-12-21 – 2012-12-23 (×3): 500 mg via ORAL
  Filled 2012-12-21 (×3): qty 1

## 2012-12-21 NOTE — Clinical Documentation Improvement (Signed)
THIS DOCUMENT IS NOT A PERMANENT PART OF THE MEDICAL RECORD  Please update your documentation within the medical record to reflect your response to this query. If you need help knowing how to do this please call [phone].  12/21/12  Dear Dr. Timothy Lasso,   Abnormal findings (laboratory, x-ray, pathologic, and other diagnostic results) are not coded and reported unless the physician indicates their clinical significance.     On admit Sodium:133, treated with NS IV with KCL at 50cc/hr.  Please add the diagnosis being treated to Notes and DC summary to illustrate this patient's severity of illness and risk of mortality. Thank you.  Possible Clinical Conditions?                                 - Hyponatremia - Hypokalemia - other conditions (please specify)   Reviewed: additional documentation in the medical record   Thank You,  Beverley Fiedler  Clinical Documentation Specialist: [phone] Health Information Management [place]

## 2012-12-21 NOTE — Evaluation (Signed)
Occupational Therapy Evaluation Patient Details Name: Meghan Sexton MRN: [id number] DOB: 1923/04/02 Today's Date: 12/21/2012 Time: 6295-2841 OT Time Calculation (min): 27 min  OT Assessment / Plan / Recommendation History of present illness 54 F c numerous issues.  She saw me prior to Thanksgiving c Viral URI.  She reports she never really got better and stayed weak, anorexic and AFTT.  She worsened yesterday c fever, chills, Rigors, SOB, Wet productive (yellow-white sputum) cough, HA, Myalgias, Nausea and congestion.  Pt denies diarrhea, any urinary symptoms, CP and vomiting though she has had a loose stool yesterday.  [facility].  WBC 26+ c rest of CMET/CBC fine, Lactate up, Temp > 100.5, Sats fine but due to Sxs FIO2 started, UA unremarkable, CXR unremarkable.  Started on Rocephin/Azithro for clinical PNA.  Influenza PCR ordered c empiric Tamiflu.  Fluids given.  Admitted for eval and Rx.  She is globally weak.  She is coughing continuously.  She looks ill.   Clinical Impression   Pt doing well and is at sup/set up level with ADLs and sup wih ADL mobility. All education completed and no further acute OT services indicated at this time. OT will sign off    OT Assessment  Patient does not need any further OT services    Follow Up Recommendations  No OT follow up;Supervision - Intermittent    Barriers to Discharge  none    Equipment Recommendations  None recommended by OT    Recommendations for Other Services    Frequency       Precautions / Restrictions Precautions Precautions: None Restrictions Weight Bearing Restrictions: No   Pertinent Vitals/Pain No c/o pain    ADL  Grooming: Performed;Wash/dry hands;Wash/dry face;Supervision/safety Where Assessed - Grooming: Unsupported standing Upper Body Bathing: Simulated;Supervision/safety;Set up Lower Body Bathing: Simulated;Supervision/safety;Set up Upper Body Dressing:  Performed;Supervision/safety;Set up Lower Body Dressing: Performed;Supervision/safety;Set up Toilet Transfer: Performed;Supervision/safety Acupuncturist: Regular height toilet;Grab bars Toileting - Architect and Hygiene: Performed;Supervision/safety Where Assessed - Engineer, mining and Hygiene: Standing Tub/Shower Transfer Method: Not assessed Equipment Used: Rolling walker Transfers/Ambulation Related to ADLs: pt safe with ADL mobility, uses RW at home ADL Comments: Pt has DME and ADL A/E at home from previous back and hip surgeries    OT Diagnosis:    OT Problem List:   OT Treatment Interventions:     OT Goals(Current goals can be found in the care plan section) Acute Rehab OT Goals Patient Stated Goal: to return home Time For Goal Achievement: 12/21/12  Visit Information  Last OT Received On: 12/21/12 History of Present Illness: 79 F c numerous issues.  She saw me prior to Thanksgiving c Viral URI.  She reports she never really got better and stayed weak, anorexic and AFTT.  She worsened yesterday c fever, chills, Rigors, SOB, Wet productive (yellow-white sputum) cough, HA, Myalgias, Nausea and congestion.  Pt denies diarrhea, any urinary symptoms, CP and vomiting though she has had a loose stool yesterday.  [facility] and [facility].  WBC 26+ c rest of CMET/CBC fine, Lactate up, Temp > 100.5, Sats fine but due to Sxs FIO2 started, UA unremarkable, CXR unremarkable.  Started on Rocephin/Azithro for clinical PNA.  Influenza PCR ordered c empiric Tamiflu.  Fluids given.  Admitted for eval and Rx.  She is globally weak.  She is coughing continuously.  She looks ill.       Prior Functioning     Home  Living Family/patient expects to be discharged to:: Private residence Living Arrangements: Other relatives (lives with grand dtr) Available Help at Discharge: Family;Available 24 hours/day Type of Home: House Home Access: Level  entry Home Layout: One level Home Equipment: Walker - 2 wheels Prior Function Level of Independence: Independent with assistive device(s) Comments: uses RW Communication Communication: HOH Dominant Hand: Right         Vision/Perception Vision - History Baseline Vision: Wears glasses only for reading Patient Visual Report: No change from baseline Perception Perception: Within Functional Limits   Cognition  Cognition Arousal/Alertness: Awake/alert Behavior During Therapy: WFL for tasks assessed/performed Overall Cognitive Status: Within Functional Limits for tasks assessed    Extremity/Trunk Assessment Upper Extremity Assessment Upper Extremity Assessment: Overall WFL for tasks assessed Lower Extremity Assessment Lower Extremity Assessment: Defer to PT evaluation     Mobility Bed Mobility Bed Mobility: Supine to Sit;Sit to Supine Supine to Sit: 5: Supervision Sit to Supine: 5: Supervision Transfers Transfers: Sit to Stand;Stand to Sit Sit to Stand: 5: Supervision;From bed;From toilet Stand to Sit: To bed;To toilet;5: Supervision     Exercise     Balance Balance Balance Assessed: Yes Dynamic Sitting Balance Dynamic Sitting - Balance Support: No upper extremity supported;Feet unsupported;During functional activity Dynamic Sitting - Level of Assistance: 7: Independent Static Standing Balance Static Standing - Balance Support: During functional activity;No upper extremity supported Static Standing - Level of Assistance: 5: Stand by assistance Dynamic Standing Balance Dynamic Standing - Balance Support: Right upper extremity supported;Left upper extremity supported;During functional activity Dynamic Standing - Level of Assistance: 5: Stand by assistance   End of Session OT - End of Session Equipment Utilized During Treatment: Rolling walker Activity Tolerance: Patient tolerated treatment well Patient left: in bed;with call bell/phone within reach;with  family/visitor present  GO     Galen Manila 12/21/2012, 12:30 PM

## 2012-12-21 NOTE — Progress Notes (Signed)
Subjective: F/Up CAP. No Flu  Feeling better. Stronger than expected. Some low BPs yesterday and needed fluids. Hot one minute and cold the next. All ?s answered.  Objective: Vital signs in last 24 hours: Temp:  [98.1 F (36.7 C)-98.3 F (36.8 C)] 98.1 F (36.7 C) (12/12 0541) Pulse Rate:  [62-93] 62 (12/12 0541) Resp:  [20] 20 (12/11 1254) BP: (77-102)/(40-62) 102/62 mmHg (12/12 0541) SpO2:  [93 %-99 %] 99 % (12/12 0541) Weight change:  Last BM Date: 12/20/12  CBG (last 3)  No results found for this basename: GLUCAP,  in the last 72 hours  Intake/Output from previous day:  Intake/Output Summary (Last 24 hours) at 12/21/12 0732 Last data filed at 12/21/12 0656  Gross per 24 hour  Intake   2035 ml  Output    403 ml  Net   1632 ml   12/11 0701 - 12/12 0700 In: 2035 [P.O.:840; I.V.:895; IV Piggyback:300] Out: 403 [Urine:402; Stool:1]   Physical Exam  General appearance: Already looking better than yesterday Eyes: no scleral icterus Throat: oropharynx moist without erythema Resp: Cough and mild Rhonchi. Cardio: Reg GI: soft, non-tender; bowel sounds normal; no masses,  no organomegaly Extremities: no clubbing, cyanosis or edema Walker in the room   Lab Results:  Recent Labs  12/19/12 2200 12/20/12 0636 12/20/12 0812  NA 133*  --  133*  K 4.0  --  4.2  CL 92*  --  97  CO2 27  --  27  GLUCOSE 118*  --  115*  BUN 16  --  13  CREATININE 0.70 0.70 0.62  CALCIUM 10.5  --  9.5     Recent Labs  12/19/12 2200  AST 19  ALT 12  ALKPHOS 96  BILITOT 0.4  PROT 6.9  ALBUMIN 4.0     Recent Labs  12/19/12 2200 12/20/12 0636 12/21/12 0620  WBC 26.7* 35.7* 16.2*  NEUTROABS 24.3*  --   --   HGB 12.7 11.2* 10.4*  HCT 38.2 33.8* 30.3*  MCV 94.1 93.6 94.1  PLT 271 242 199    Lab Results  Component Value Date   INR 0.94 10/15/2012   INR 0.86 03/28/2011    No results found for this basename: CKTOTAL, CKMB, CKMBINDEX, TROPONINI,  in the last 72  hours  No results found for this basename: TSH, T4TOTAL, FREET3, T3FREE, THYROIDAB,  in the last 72 hours  No results found for this basename: VITAMINB12, FOLATE, FERRITIN, TIBC, IRON, RETICCTPCT,  in the last 72 hours  Micro Results: No results found for this or any previous visit (from the past 240 hour(s)).   Studies/Results: Dg Chest 2 View  12/19/2012   CLINICAL DATA:  Cough, chills.  History of hypertension and asthma.  EXAM: CHEST  2 VIEW  COMPARISON:  10/15/2012  FINDINGS: Patient has had previous posterior spinal fusion. Heart size is normal. There are no focal consolidations or pleural effusions. No pulmonary edema. Previous ligamentous repair of the right shoulder.  IMPRESSION: No evidence for acute cardiopulmonary abnormality.   Electronically Signed   By: Rosalie Gums M.D.   On: 12/19/2012 22:36   Dg Chest Port 1v Same Day  12/20/2012   CLINICAL DATA:  Cough/fever  EXAM: PORTABLE CHEST - 1 VIEW SAME DAY  COMPARISON:  12/19/2012  FINDINGS: Low lung volumes. This accentuates the pulmonary findings. The technique taking into consideration there has been interval development of thickening of the interstitial markings. Bilateral perihilar opacities appreciated right greater than left. No  focal reason consolidation appreciated. Cardiac silhouette is within normal limits. The aorta is tortuous. Spinal fixation rods identified within the thoracolumbar spine. The osseous structures demonstrate degenerative changes within the shoulders.  IMPRESSION: Low lung volumes. There has been interval development of underlying interstitial infiltrate differential considerations are infectious versus inflammatory. Surveillance evaluation recommended.   Electronically Signed   By: Salome Holmes M.D.   On: 12/20/2012 08:15     Medications: Scheduled: . ipratropium  0.5 mg Nebulization TID   And  . albuterol  2.5 mg Nebulization TID  . antiseptic oral rinse  15 mL Mouth Rinse BID  . azithromycin  500  mg Intravenous Q24H  . benazepril  20 mg Oral Daily  . cefTRIAXone (ROCEPHIN)  IV  1 g Intravenous Q24H  . cycloSPORINE  1 drop Both Eyes Q12H  . docusate sodium  100 mg Oral BID  . enoxaparin (LOVENOX) injection  30 mg Subcutaneous Q24H  . feeding supplement (ENSURE COMPLETE)  237 mL Oral TID BM  . fentaNYL  50 mcg Transdermal Q72H  . fluticasone  2 puff Inhalation BID  . gabapentin  100 mg Oral BID  . iron polysaccharides  150 mg Oral Daily  . levothyroxine  125 mcg Oral QAC breakfast  . pantoprazole  40 mg Oral Daily  . polyethylene glycol  17 g Oral BID  . traZODone  50 mg Oral QHS   Continuous: . 0.9 % NaCl with KCl 20 mEq / L 75 mL/hr at 12/21/12 4098     Assessment/Plan: Principal Problem:   Influenza Active Problems:   HYPERTENSION   COPD   Leukocytosis, unspecified  PNA - FIO2, Pulm Toilet, Abx - change Azithro to PO.  Change Rocephin to Omnicef on D/c.  Wean O2 to 0 over the weekend.  ? D/c on Sunday.   Fever/chills/Rigors c Leukocytosis - Flu (-).  Sed rate (-).  Leukocytosis improving.  No more fever. No Flu - off Resp Isolation and off Tamiflu.   General Weakness and AFTT in a pt c numerous Ortho surgeries and procedures - most recently 10/2012 Ant THR per Dr Olin. Lots of back surgeries. Walks c Hand Brake Rolling Walker.- PT/OT/CW -  Stronger than expected.  No PT follow up needed on D/c  Anorexic due to illness - Nutrition consult c recs: 1. Supplements; Ensure Complete po TID, each supplement provides 350 kcal and 13 grams of protein.  2. General healthful diet; encourage intake of foods and beverages as able. RD to follow and assess for nutritional adequacy.   She said she ate some yesterday and anticipates more today.   Recent Viral Gastroenteritis probably weakened her to be susceptible to current PNA.  Chronic pain c Severe Chronic LBP - Continue Fentanyl and mult meds   HTN - controlled - Hold Diuretics while getting fluids. Parameters written on  other BP meds.  Needed IVF boluses 12/20/12 for Hypotension  Hypothyroidism - On Synthroid.  Osteoporosis on q3m Boniva.  GERD/HH/Chronic Gastritis/Nissen Fundiplication 01/09/04 - On PPI DVT Proph - Lovenox.  Anticipate D/c on Sunday if able.   ID -  Anti-infectives   Start     Dose/Rate Route Frequency Ordered Stop   12/21/12 0000  azithromycin (ZITHROMAX) 500 mg in dextrose 5 % 250 mL IVPB     500 mg 250 mL/hr over 60 Minutes Intravenous Every 24 hours 12/20/12 0228     12 /11/14 2200  cefTRIAXone (ROCEPHIN) 1 g in dextrose 5 % 50 mL IVPB  1 g 100 mL/hr over 30 Minutes Intravenous Every 24 hours 12/20/12 0228     12/20/12 0230  oseltamivir (TAMIFLU) capsule 75 mg  Status:  Discontinued     75 mg Oral 2 times daily 12/20/12 0228 12/20/12 1730   12/20/12 0029  cefTRIAXone (ROCEPHIN) 1 G injection    Comments:  Gertie Baron   : cabinet override      12/20/12 0029 12/20/12 1244   12/19/12 2330  cefTRIAXone (ROCEPHIN) 1 g in dextrose 5 % 50 mL IVPB     1 g 100 mL/hr over 30 Minutes Intravenous  Once 12/19/12 2320 12/20/12 0104   12/19/12 2330  azithromycin (ZITHROMAX) 500 mg in dextrose 5 % 250 mL IVPB     500 mg 250 mL/hr over 60 Minutes Intravenous  Once 12/19/12 2320 12/20/12 0039      LOS: 2 days   Sanav Remer M 12/21/2012, 7:32 AM

## 2012-12-22 LAB — BASIC METABOLIC PANEL
BUN: 14 mg/dL (ref 6–23)
CO2: 29 mEq/L (ref 19–32)
Calcium: 9.6 mg/dL (ref 8.4–10.5)
Chloride: 101 mEq/L (ref 96–112)
Creatinine, Ser: 0.63 mg/dL (ref 0.50–1.10)
GFR calc Af Amer: >90 mL/min (ref >90)
GFR calc non Af Amer: 78 mL/min — ABNORMAL LOW (ref >90)
Glucose, Bld: 128 mg/dL — ABNORMAL HIGH (ref 70–99)
Potassium: 5.4 mEq/L — ABNORMAL HIGH (ref 3.5–5.1)
Sodium: 136 mEq/L (ref 135–145)

## 2012-12-22 LAB — CBC
Hemoglobin: 10.1 g/dL — ABNORMAL LOW (ref 12.0–15.0)
MCH: 31.9 pg (ref 26.0–34.0)
MCV: 96.2 fL (ref 78.0–100.0)
Platelets: 198 10*3/uL (ref 150–400)
RBC: 3.17 MIL/uL — ABNORMAL LOW (ref 3.87–5.11)

## 2012-12-22 MED ORDER — IPRATROPIUM BROMIDE 0.02 % IN SOLN
0.5000 mg | Freq: Two times a day (BID) | RESPIRATORY_TRACT | Status: DC
  Administered 2012-12-22 – 2012-12-24 (×4): 0.5 mg via RESPIRATORY_TRACT
  Filled 2012-12-22 (×5): qty 2.5

## 2012-12-22 MED ORDER — SODIUM CHLORIDE 0.9 % IV SOLN
INTRAVENOUS | Status: DC
  Administered 2012-12-22 – 2012-12-24 (×3): via INTRAVENOUS

## 2012-12-22 MED ORDER — ALBUTEROL SULFATE (5 MG/ML) 0.5% IN NEBU
2.5000 mg | INHALATION_SOLUTION | Freq: Two times a day (BID) | RESPIRATORY_TRACT | Status: DC
  Administered 2012-12-22 – 2012-12-23 (×2): 2.5 mg via RESPIRATORY_TRACT
  Filled 2012-12-22 (×2): qty 0.5

## 2012-12-22 NOTE — Progress Notes (Signed)
Subjective: Having some productive cough and no dyspnea at rest on nasal cannula oxygen, eating some food and not having significant diarrhea.  Objective: Vital signs in last 24 hours: Temp:  [97.9 F (36.6 C)-98.9 F (37.2 C)] 98.4 F (36.9 C) (12/13 0453) Pulse Rate:  [65-87] 65 (12/13 0453) Resp:  [20] 20 (12/13 0453) BP: (90-118)/(50-63) 108/63 mmHg (12/13 0453) SpO2:  [96 %-100 %] 97 % (12/13 0811) Weight change:    Intake/Output from previous day: 12/12 0701 - 12/13 0700 In: 997.5 [P.O.:360; I.V.:587.5; IV Piggyback:50] Out: -    General appearance: alert, cooperative and no distress Resp: bibasilar crackles, no wheezing Cardio: regular rate and rhythm, S1, S2 normal, no murmur, click, rub or gallop GI: soft, non-tender; bowel sounds normal; no masses,  no organomegaly Extremities: extremities normal, atraumatic, no cyanosis or edema  Lab Results:  Recent Labs  12/21/12 0620 12/22/12 0405  WBC 16.2* 12.3*  HGB 10.4* 10.1*  HCT 30.3* 30.5*  PLT 199 198   BMET  Recent Labs  12/21/12 0620 12/22/12 0405  NA 134* 136  K 5.3* 5.4*  CL 101 101  CO2 26 29  GLUCOSE 110* 128*  BUN 13 14  CREATININE 0.63 0.63  CALCIUM 9.3 9.6   CMET CMP     Component Value Date/Time   NA 136 12/22/2012 0405   K 5.4* 12/22/2012 0405   CL 101 12/22/2012 0405   CO2 29 12/22/2012 0405   GLUCOSE 128* 12/22/2012 0405   BUN 14 12/22/2012 0405   CREATININE 0.63 12/22/2012 0405   CALCIUM 9.6 12/22/2012 0405   PROT 6.9 12/19/2012 2200   ALBUMIN 4.0 12/19/2012 2200   AST 19 12/19/2012 2200   ALT 12 12/19/2012 2200   ALKPHOS 96 12/19/2012 2200   BILITOT 0.4 12/19/2012 2200   GFRNONAA 78* 12/22/2012 0405   GFRAA >90 12/22/2012 0405    CBG (last 3)  No results found for this basename: GLUCAP,  in the last 72 hours  INR RESULTS:   Lab Results  Component Value Date   INR 0.94 10/15/2012   INR 0.86 03/28/2011     Studies/Results: No results found.  Medications: I have  reviewed the patient's current medications.  Assessment/Plan: #1 Pneumonia: much improved and we will check pulse oximetry at rest and with ambulating to see if she needs home oxygen, and if she continues to improve may discharge her within the next 24-48 hours. #2 Anemia: mild and stable #3 Weakness: stable and we will have her ambulate in the halls with a walker and assistance today.   LOS: 3 days   Oryan Winterton G 12/22/2012, 11:00 AM

## 2012-12-22 NOTE — Progress Notes (Signed)
Patient's Sp02 100 on 2L. Sitting on edge of bed Sp02 98, ambulation Sp02 97, back in bed Sp02 98 all on room air. No complaints of SOB or distress.

## 2012-12-23 ENCOUNTER — Inpatient Hospital Stay (HOSPITAL_COMMUNITY): Payer: Medicare Other

## 2012-12-23 DIAGNOSIS — E875 Hyperkalemia: Secondary | ICD-10-CM | POA: Diagnosis not present

## 2012-12-23 DIAGNOSIS — D649 Anemia, unspecified: Secondary | ICD-10-CM | POA: Diagnosis present

## 2012-12-23 DIAGNOSIS — J189 Pneumonia, unspecified organism: Secondary | ICD-10-CM | POA: Diagnosis present

## 2012-12-23 LAB — CBC
HCT: 32 % — ABNORMAL LOW (ref 36.0–46.0)
MCH: 30.8 pg (ref 26.0–34.0)
MCHC: 32.2 g/dL (ref 30.0–36.0)
Platelets: 218 10*3/uL (ref 150–400)
RDW: 12.9 % (ref 11.5–15.5)
WBC: 8.3 10*3/uL (ref 4.0–10.5)

## 2012-12-23 MED ORDER — GUAIFENESIN 100 MG/5ML PO SYRP
400.0000 mg | ORAL_SOLUTION | Freq: Three times a day (TID) | ORAL | Status: DC
  Administered 2012-12-23 – 2012-12-24 (×4): 400 mg via ORAL
  Filled 2012-12-23 (×6): qty 20

## 2012-12-23 MED ORDER — PREDNISONE 20 MG PO TABS
20.0000 mg | ORAL_TABLET | Freq: Every day | ORAL | Status: DC
  Administered 2012-12-23 – 2012-12-24 (×2): 20 mg via ORAL
  Filled 2012-12-23 (×3): qty 1

## 2012-12-23 MED ORDER — ALBUTEROL SULFATE (5 MG/ML) 0.5% IN NEBU
2.5000 mg | INHALATION_SOLUTION | Freq: Three times a day (TID) | RESPIRATORY_TRACT | Status: DC
  Administered 2012-12-23 – 2012-12-24 (×4): 2.5 mg via RESPIRATORY_TRACT
  Filled 2012-12-23 (×4): qty 0.5

## 2012-12-23 NOTE — Progress Notes (Signed)
Subjective: Continues to have weakness and cough with difficulty bringing up secretions. She was very anxious about the possibility of discharge today and requested that we try to improve her cough a bit more prior to discharge as she is worried that she could need to be readmitted.  Objective: Vital signs in last 24 hours: Temp:  [98.1 F (36.7 C)-98.3 F (36.8 C)] 98.1 F (36.7 C) (12/14 0612) Pulse Rate:  [64-69] 65 (12/14 0612) Resp:  [16-17] 17 (12/14 0612) BP: (125-158)/(63-78) 158/77 mmHg (12/14 0612) SpO2:  [95 %-100 %] 96 % (12/14 0956) Weight change:    Intake/Output from previous day: 12/13 0701 - 12/14 0700 In: 2114.2 [P.O.:650; I.V.:964.2; IV Piggyback:500] Out: -    General appearance: alert, cooperative and occasional dry cough, and no dyspnea at rest on room air Resp: decreased breath sounds left base Cardio: regular rate and rhythm Extremities: bilateral trace leg edema  Lab Results:  Recent Labs  12/22/12 0405 12/23/12 0500  WBC 12.3* 8.3  HGB 10.1* 10.3*  HCT 30.5* 32.0*  PLT 198 218   BMET  Recent Labs  12/21/12 0620 12/22/12 0405  NA 134* 136  K 5.3* 5.4*  CL 101 101  CO2 26 29  GLUCOSE 110* 128*  BUN 13 14  CREATININE 0.63 0.63  CALCIUM 9.3 9.6   CMET CMP     Component Value Date/Time   NA 136 12/22/2012 0405   K 5.4* 12/22/2012 0405   CL 101 12/22/2012 0405   CO2 29 12/22/2012 0405   GLUCOSE 128* 12/22/2012 0405   BUN 14 12/22/2012 0405   CREATININE 0.63 12/22/2012 0405   CALCIUM 9.6 12/22/2012 0405   PROT 6.9 12/19/2012 2200   ALBUMIN 4.0 12/19/2012 2200   AST 19 12/19/2012 2200   ALT 12 12/19/2012 2200   ALKPHOS 96 12/19/2012 2200   BILITOT 0.4 12/19/2012 2200   GFRNONAA 78* 12/22/2012 0405   GFRAA >90 12/22/2012 0405    CBG (last 3)  No results found for this basename: GLUCAP,  in the last 72 hours  INR RESULTS:   Lab Results  Component Value Date   INR 0.94 10/15/2012   INR 0.86 03/28/2011      Studies/Results: No results found.  Medications: I have reviewed the patient's current medications.  Assessment/Plan: #1 Dyspnea and Cough: slowly improving and there could be a component of brochospasm, so we will recheck a chest x-ray today, check a peak flow, increase neb frequency, and add robitussin and prednisone. We will reevaluate in the AM for possible discharge. #2 Anemia: mild and stable #3 Hyperkalemia: mild and we will recheck potassium in the am.   LOS: 4 days   Shamere Dilworth G 12/23/2012, 10:40 AM

## 2012-12-23 NOTE — Progress Notes (Signed)
Peak flow preformed.  Goal at her height and age would be 350.   Pt was very cooperative.  Pre 160 and post 170.  The treatment seemed to loosen secretions and pt was congested.  Coughed small amount sec up.

## 2012-12-24 DIAGNOSIS — E871 Hypo-osmolality and hyponatremia: Secondary | ICD-10-CM

## 2012-12-24 LAB — GLUCOSE, CAPILLARY
Glucose-Capillary: 100 mg/dL — ABNORMAL HIGH (ref 70–99)
Glucose-Capillary: 118 mg/dL — ABNORMAL HIGH (ref 70–99)

## 2012-12-24 LAB — BASIC METABOLIC PANEL
CO2: 27 mEq/L (ref 19–32)
Calcium: 10 mg/dL (ref 8.4–10.5)
Chloride: 98 mEq/L (ref 96–112)
GFR calc Af Amer: >90 mL/min (ref >90)
Potassium: 5.1 mEq/L (ref 3.5–5.1)
Sodium: 133 mEq/L — ABNORMAL LOW (ref 135–145)

## 2012-12-24 LAB — CBC
Hemoglobin: 10.3 g/dL — ABNORMAL LOW (ref 12.0–15.0)
MCH: 31.3 pg (ref 26.0–34.0)
Platelets: 233 10*3/uL (ref 150–400)
RBC: 3.29 MIL/uL — ABNORMAL LOW (ref 3.87–5.11)
RDW: 12.7 % (ref 11.5–15.5)
WBC: 9.4 10*3/uL (ref 4.0–10.5)

## 2012-12-24 MED ORDER — BENAZEPRIL-HYDROCHLOROTHIAZIDE 20-12.5 MG PO TABS: 1.0000 | ORAL_TABLET | Freq: Every morning | ORAL | Status: DC

## 2012-12-24 MED ORDER — CEFDINIR 300 MG PO CAPS: 300.0000 mg | ORAL_CAPSULE | Freq: Two times a day (BID) | ORAL | Status: DC

## 2012-12-24 MED ORDER — ALUM & MAG HYDROXIDE-SIMETH 200-200-20 MG/5ML PO SUSP: 30.0000 mL | Freq: Four times a day (QID) | ORAL | Status: DC | PRN

## 2012-12-24 MED ORDER — ENSURE COMPLETE PO LIQD: 237.0000 mL | ORAL | Status: DC

## 2012-12-24 MED ORDER — FERROUS SULFATE 325 (65 FE) MG PO TABS: ORAL_TABLET | ORAL | Status: DC

## 2012-12-24 MED ORDER — GUAIFENESIN 100 MG/5ML PO SYRP: 400.0000 mg | ORAL_SOLUTION | Freq: Three times a day (TID) | ORAL | Status: DC | PRN

## 2012-12-24 MED ORDER — ACETAMINOPHEN 325 MG PO TABS: 650.0000 mg | ORAL_TABLET | Freq: Four times a day (QID) | ORAL | Status: DC | PRN

## 2012-12-24 MED ORDER — PREDNISONE 20 MG PO TABS: 20.0000 mg | ORAL_TABLET | Freq: Every day | ORAL | Status: DC

## 2012-12-24 NOTE — Discharge Summary (Signed)
Physician Discharge Summary  DISCHARGE SUMMARY   Patient ID: Meghan Sexton MR#: [id number] DOB/AGE: [birth year] 79 y.o.   Attending Physician:[name]  Patient's [name],[name], MD  Consults: none  Admit date: 12/19/2012 Discharge date: 12/24/2012  Discharge Diagnoses:  Principal Problem:   CAP (community acquired pneumonia) Active Problems:   HYPERTENSION   COPD   Influenza   Leukocytosis, unspecified   Anemia   Hyperkalemia   Hyponatremia   Patient Active Problem List   Diagnosis Date Noted  . Hyponatremia 12/24/2012  . CAP (community acquired pneumonia) 12/23/2012    Class: Acute  . Anemia 12/23/2012    Class: Chronic  . Hyperkalemia 12/23/2012    Class: Acute  . Influenza 12/20/2012  . Leukocytosis, unspecified 12/20/2012    Class: Acute  . Asthma 12/04/2012  . Insomnia 12/04/2012  . Hip arthritis 12/03/2012  . Acute posthemorrhagic anemia 12/03/2012  . Unspecified hypothyroidism 12/03/2012  . S/P left THA, AA 10/23/2012  . S/P Left TKA 04/04/2011  . HYPERTENSION 03/12/2008  . COPD 03/12/2008  . CONSTIPATION 03/12/2008  . OTHER DYSPHAGIA 03/12/2008  . ESOPHAGEAL STRICTURE 03/23/2007  . DYSPHAGIA UNSPECIFIED 03/23/2007  . EPIGASTRIC PAIN 03/23/2007  . HEMORRHOIDS, EXTERNAL 11/01/2006  . GERD 11/01/2006  . HIATAL HERNIA 11/01/2006  . DIVERTICULOSIS, COLON 11/01/2006   Past Medical History  Diagnosis Date  . Hypertension   . Asthma   . Hypothyroidism   . Constipation   . Blood transfusion 2010     1 unit after each back surgery  . Sleep apnea     stopbang=4  . Anxiety   . Depression   . GERD (gastroesophageal reflux disease)   . Arthritis     Discharged Condition: Better, stable.   Discharge Medications:   Medication List    STOP taking these medications       hydrochlorothiazide 12.5 MG tablet  Commonly known as:  HYDRODIURIL      TAKE these medications       acetaminophen 325 MG tablet  Commonly known as:  TYLENOL   Take 2 tablets (650 mg total) by mouth every 6 (six) hours as needed for mild pain (or Fever >/= 101).     albuterol 108 (90 BASE) MCG/ACT inhaler  Commonly known as:  PROVENTIL HFA;VENTOLIN HFA  Inhale 1-2 puffs into the lungs every 6 (six) hours as needed for wheezing (Use q 6 hours for next 3 days then prn).     alum & mag hydroxide-simeth 200-200-20 MG/5ML suspension  Commonly known as:  MAALOX/MYLANTA  Take 30 mLs by mouth every 6 (six) hours as needed for indigestion or heartburn (dyspepsia).     benazepril-hydrochlorthiazide 20-12.5 MG per tablet  Commonly known as:  LOTENSIN HCT  Take 1 tablet by mouth every morning.  Start taking on:  12/31/2012     budesonide 180 MCG/ACT inhaler  Commonly known as:  PULMICORT  Inhale 2 puffs into the lungs at bedtime.     CALCIUM & MAGNESIUM CARBONATES PO  Take 1 tablet by mouth every morning.     cefdinir 300 MG capsule  Commonly known as:  OMNICEF  Take 1 capsule (300 mg total) by mouth 2 (two) times daily.     cycloSPORINE 0.05 % ophthalmic emulsion  Commonly known as:  RESTASIS  Place 1 drop into both eyes every 12 (twelve) hours.     diazepam 2 MG tablet  Commonly known as:  VALIUM  Take 2 mg by mouth every 6 (six) hours as needed.  For anxiety     DSS 100 MG Caps  Take 100 mg by mouth 2 (two) times daily.     feeding supplement (ENSURE COMPLETE) Liqd  Take 237 mLs by mouth daily.     fentaNYL 50 MCG/HR  Commonly known as:  DURAGESIC - dosed mcg/hr  Place 1 patch onto the skin every 3 (three) days.     ferrous sulfate 325 (65 FE) MG tablet  Take 1 tablet (325 mg total) by mouth 3 (three) times per week after meals.     furosemide 40 MG tablet  Commonly known as:  LASIX  Take 40 mg by mouth daily as needed for edema.     gabapentin 100 MG capsule  Commonly known as:  NEURONTIN  Take 100 mg by mouth 2 (two) times daily.     guaifenesin 100 MG/5ML syrup  Commonly known as:  ROBITUSSIN  Take 20 mLs (400 mg total)  by mouth 3 (three) times daily as needed for cough or congestion.     HYDROcodone-acetaminophen 7.5-325 MG per tablet  Commonly known as:  NORCO  Take 1-2 tablets by mouth every 4 (four) hours.     hyoscyamine 0.125 MG Tbdp disintergrating tablet  Commonly known as:  ANASPAZ  Place 0.125 mg under the tongue every 4 (four) hours as needed. May take 1 to 2 tablets. For stomach cramps.     ipratropium 0.06 % nasal spray  Commonly known as:  ATROVENT  Place 2 sprays into the nose every morning.     levothyroxine 125 MCG tablet  Commonly known as:  SYNTHROID, LEVOTHROID  Take 125 mcg by mouth every morning.     multivitamin with minerals Tabs tablet  Take 1 tablet by mouth every morning.     pantoprazole 40 MG tablet  Commonly known as:  PROTONIX  Take 40 mg by mouth daily.     polyethylene glycol packet  Commonly known as:  MIRALAX / GLYCOLAX  Take 17 g by mouth 2 (two) times daily.     predniSONE 20 MG tablet  Commonly known as:  DELTASONE  Take 1 tablet (20 mg total) by mouth daily before breakfast.     quiNINE 324 MG capsule  Commonly known as:  QUALAQUIN  Take 648 mg by mouth every 8 (eight) hours as needed. For muscle cramps in legs     traZODone 50 MG tablet  Commonly known as:  DESYREL  Take 50 mg by mouth at bedtime.     vitamin C 250 MG tablet  Commonly known as:  ASCORBIC ACID  Take 1,000 mg by mouth every morning.        Hospital Procedures: Dg Chest 2 View  12/23/2012   CLINICAL DATA:  Cough, chest congestion, shortness of breath  EXAM: CHEST  2 VIEW  COMPARISON:  12/20/2012  FINDINGS: Chronic interstitial markings. No focal consolidation. No pleural effusion or pneumothorax.  Heart is normal in size.  Thoracolumbar spinal fixation rods.  IMPRESSION: No evidence of acute cardiopulmonary disease.   Electronically Signed   By: Charline Bills M.D.   On: 12/23/2012 18:35   Dg Chest 2 View  12/19/2012   CLINICAL DATA:  Cough, chills.  History of  hypertension and asthma.  EXAM: CHEST  2 VIEW  COMPARISON:  10/15/2012  FINDINGS: Patient has had previous posterior spinal fusion. Heart size is normal. There are no focal consolidations or pleural effusions. No pulmonary edema. Previous ligamentous repair of the right shoulder.  IMPRESSION: No evidence  for acute cardiopulmonary abnormality.   Electronically Signed   By: Rosalie Gums M.D.   On: 12/19/2012 22:36   Dg Chest Port 1v Same Day  12/20/2012   CLINICAL DATA:  Cough/fever  EXAM: PORTABLE CHEST - 1 VIEW SAME DAY  COMPARISON:  12/19/2012  FINDINGS: Low lung volumes. This accentuates the pulmonary findings. The technique taking into consideration there has been interval development of thickening of the interstitial markings. Bilateral perihilar opacities appreciated right greater than left. No focal reason consolidation appreciated. Cardiac silhouette is within normal limits. The aorta is tortuous. Spinal fixation rods identified within the thoracolumbar spine. The osseous structures demonstrate degenerative changes within the shoulders.  IMPRESSION: Low lung volumes. There has been interval development of underlying interstitial infiltrate differential considerations are infectious versus inflammatory. Surveillance evaluation recommended.   Electronically Signed   By: Salome Holmes M.D.   On: 12/20/2012 08:15    History of Present Illness:  41 F c numerous issues. She saw me prior to Thanksgiving c Viral URI and gastroenteritis. She reports she never really got better and stayed weak, anorexic and AFTT. She worsened and @ 12/19/12 she developed fever, chills, Rigors, SOB, Wet productive (yellow-white sputum) cough, HA, Myalgias, Nausea and congestion. Pt denied diarrhea, any urinary symptoms, CP and vomiting though she has had a loose stool 1 day PTA. [facility] and evaluated. WBC 26+ c rest of CMET/CBC fine, Lactate up, Temp > 100.5, Sats fine but due to Sxs FIO2 started, UA  unremarkable, CXR unremarkable. Started on Rocephin/Azithro for clinical PNA. Influenza PCR ordered c empiric Tamiflu. Fluids given. Admitted for eval and Rx. She was globally weak. She was coughing continuously. She looked ill.   Hospital Course:  Ms Limburg was admitted for clinical CAP c fever, chills, rigors, wet cough, leukocytosis.  The second Chest X Ray showed interval development of underlying interstitial infiltrate. Her Leukocytosis was impressive and went from 26.7 up to 35.7 prior to improving. For the PNA she was treated c FIO2, Pulm Toilet, Abx = Azithro (for 5 days) and Rocephin for 5 days and now being converted to Oral Cefdinir for 5 more days. Her O2 was weaned to Room air easily over this past weekend.  Her SOB and cough is much better.  She is clinically stable.  She is ready for D/C today.  She remained on inhalers/Nebs/Incentive Spirometry/Flutter valve and Dr Eloise Harman added Robitussin and steroids.  She still had poor peak flows but she is much better than admit. 12/14 repeat CXR was much better.   Fever/chills/Rigors c Leukocytosis - Flu (-). Sed rate (-). Leukocytosis improving. No more fever. Blood and U Cxs were (-).  Leukocytosis - WBC back to normal and 9.4 today.  General Weakness and AFTT in a pt c numerous Ortho surgeries and procedures - most recently 10/2012 Ant THR per Dr Charlann Boxer. Lots of back surgeries. Walks c Product/process development scientist.- PT/OT/CW - Stronger than expected. No PT or OT follow up needed on D/c   Anorexic due to illness - Nutrition consult c recs:  1. Supplements; Ensure Complete po TID, each supplement provides 350 kcal and 13 grams of protein.  2. General healthful diet; encourage intake of foods and beverages as able. RD to follow and assess for nutritional adequacy.  She is doing much better and can do one supplement on D/c per day  Hyponatremia - She is on HCT as an outpt.  133-136 Na during hospital stay.  NTD.  Monitor.  Mild Post Hydration  Anemia and 2 to acute illness - Hbg 10.1 - 10.3 and fine.  Will follow and address as outpt.  Stay on Iron.  Recent Viral Gastroenteritis probably weakened her to be susceptible to current PNA.   Chronic pain c Severe Chronic LBP - Continue Fentanyl and mult meds   HTN - BP was low in first 24 hrs of admission and we held diuretics and put parameters on her ACEi.  She Needed IVF boluses 12/20/12 for Hypotension.  She is better.  IVF d/ced.  I will restart anti-HTN drugs as an outpt. Her K was 5.1 on last labs - will follow.    Hypothyroidism - On Synthroid.   Osteoporosis on q71m Boniva.   GERD/HH/Chronic Gastritis/Nissen Fundiplication 01/09/04 - On PPI   DVT Proph - Lovenox was provided.   Anticipate D/c today as she is much better than admit.  She is breathing better.  Cough is improving.  Less SOB.  Poor sleep.  Eating better.  Having BMs - some looser due to Abx.  O/w doing much better than last week.    Day of Discharge Exam BP 163/74  Pulse 61  Temp(Src) 97.8 F (36.6 C) (Oral)  Resp 16  Ht 4\' 10"  (1.473 m)  Wt 58.151 kg (128 lb 3.2 oz)  BMI 26.80 kg/m2  SpO2 95%  Physical Exam:  General appearance: alert, cooperative and no cough while I was in room, and no dyspnea at rest on room air  Resp: Mild diminished breath sounds left base, No rhonchi Cardio: regular rate and rhythm  Extremities: bilateral trace leg edema     Discharge Labs:  Recent Labs  12/22/12 0405 12/24/12 0408  NA 136 133*  K 5.4* 5.1  CL 101 98  CO2 29 27  GLUCOSE 128* 115*  BUN 14 14  CREATININE 0.63 0.53  CALCIUM 9.6 10.0   No results found for this basename: AST, ALT, ALKPHOS, BILITOT, PROT, ALBUMIN,  in the last 72 hours  Recent Labs  12/23/12 0500 12/24/12 0408  WBC 8.3 9.4  HGB 10.3* 10.3*  HCT 32.0* 31.2*  MCV 95.8 94.8  PLT 218 233   No results found for this basename: CKTOTAL, CKMB, CKMBINDEX, TROPONINI,  in the last 72 hours No results found for this basename: TSH,  T4TOTAL, FREET3, T3FREE, THYROIDAB,  in the last 72 hours No results found for this basename: VITAMINB12, FOLATE, FERRITIN, TIBC, IRON, RETICCTPCT,  in the last 72 hours Lab Results  Component Value Date   INR 0.94 10/15/2012   INR 0.86 03/28/2011       Discharge instructions:  03-Skilled Nursing Facility Follow-up Information   Follow up with Gwen Pounds, MD.   Specialty:  Internal Medicine   Contact information:   [phone] [facility] [place] [address] [phone]        Disposition: Home c Family.  Granddaughter will help her.  Follow-up Appts: Follow-up with Dr. Timothy Lasso at [facility] in 5 days.  Call for appointment.  Condition on Discharge: stable  Tests Needing Follow-up: Labs and cxr  Time spent in discharge (includes decision making & examination of pt): 35 min  Signed: Dawna Jakes M 12/24/2012, 7:37 AM

## 2012-12-26 LAB — CULTURE, BLOOD (ROUTINE X 2)
Culture: NO GROWTH
Culture: NO GROWTH

## 2014-03-26 ENCOUNTER — Encounter (HOSPITAL_COMMUNITY)
Admission: RE | Admit: 2014-03-26 | Discharge: 2014-03-26 | Disposition: A | Discharge status: Home / Self Care | Payer: Medicare Other | Source: Ambulatory Visit | Attending: Internal Medicine | Admitting: Internal Medicine

## 2014-03-26 ENCOUNTER — Encounter (HOSPITAL_COMMUNITY): Payer: Self-pay

## 2014-03-26 DIAGNOSIS — M810 Age-related osteoporosis without current pathological fracture: Secondary | ICD-10-CM | POA: Insufficient documentation

## 2014-03-26 MED ORDER — DENOSUMAB 60 MG/ML ~~LOC~~ SOLN
60.0000 mg | Freq: Once | SUBCUTANEOUS | Status: AC
  Administered 2014-03-26: 60 mg via SUBCUTANEOUS
  Filled 2014-03-26: qty 1

## 2014-03-26 NOTE — Discharge Instructions (Signed)
Denosumab injection What is this medicine? DENOSUMAB (den oh sue mab) slows bone breakdown. Prolia is used to treat osteoporosis in women after menopause and in men. Xgeva is used to prevent bone fractures and other bone problems caused by cancer bone metastases. Xgeva is also used to treat giant cell tumor of the bone. This medicine may be used for other purposes; ask your health care provider or pharmacist if you have questions. COMMON BRAND NAME(S): Prolia, XGEVA What should I tell my health care provider before I take this medicine? They need to know if you have any of these conditions: -dental disease -eczema -infection or history of infections -kidney disease or on dialysis -low blood calcium or vitamin D -malabsorption syndrome -scheduled to have surgery or tooth extraction -taking medicine that contains denosumab -thyroid or parathyroid disease -an unusual reaction to denosumab, other medicines, foods, dyes, or preservatives -pregnant or trying to get pregnant -breast-feeding How should I use this medicine? This medicine is for injection under the skin. It is given by a health care professional in a hospital or clinic setting. If you are getting Prolia, a special MedGuide will be given to you by the pharmacist with each prescription and refill. Be sure to read this information carefully each time. For Prolia, talk to your pediatrician regarding the use of this medicine in children. Special care may be needed. For Xgeva, talk to your pediatrician regarding the use of this medicine in children. While this drug may be prescribed for children as young as 13 years for selected conditions, precautions do apply. Overdosage: If you think you've taken too much of this medicine contact a poison control center or emergency room at once. Overdosage: If you think you have taken too much of this medicine contact a poison control center or emergency room at once. NOTE: This medicine is only for  you. Do not share this medicine with others. What if I miss a dose? It is important not to miss your dose. Call your doctor or health care professional if you are unable to keep an appointment. What may interact with this medicine? Do not take this medicine with any of the following medications: -other medicines containing denosumab This medicine may also interact with the following medications: -medicines that suppress the immune system -medicines that treat cancer -steroid medicines like prednisone or cortisone This list may not describe all possible interactions. Give your health care provider a list of all the medicines, herbs, non-prescription drugs, or dietary supplements you use. Also tell them if you smoke, drink alcohol, or use illegal drugs. Some items may interact with your medicine. What should I watch for while using this medicine? Visit your doctor or health care professional for regular checks on your progress. Your doctor or health care professional may order blood tests and other tests to see how you are doing. Call your doctor or health care professional if you get a cold or other infection while receiving this medicine. Do not treat yourself. This medicine may decrease your body's ability to fight infection. You should make sure you get enough calcium and vitamin D while you are taking this medicine, unless your doctor tells you not to. Discuss the foods you eat and the vitamins you take with your health care professional. See your dentist regularly. Brush and floss your teeth as directed. Before you have any dental work done, tell your dentist you are receiving this medicine. Do not become pregnant while taking this medicine or for 5 months after stopping   it. Women should inform their doctor if they wish to become pregnant or think they might be pregnant. There is a potential for serious side effects to an unborn child. Talk to your health care professional or pharmacist for more  information. What side effects may I notice from receiving this medicine? Side effects that you should report to your doctor or health care professional as soon as possible: -allergic reactions like skin rash, itching or hives, swelling of the face, lips, or tongue -breathing problems -chest pain -fast, irregular heartbeat -feeling faint or lightheaded, falls -fever, chills, or any other sign of infection -muscle spasms, tightening, or twitches -numbness or tingling -skin blisters or bumps, or is dry, peels, or red -slow healing or unexplained pain in the mouth or jaw -unusual bleeding or bruising Side effects that usually do not require medical attention (Report these to your doctor or health care professional if they continue or are bothersome.): -muscle pain -stomach upset, gas This list may not describe all possible side effects. Call your doctor for medical advice about side effects. You may report side effects to FDA at 1-800-FDA-1088. Where should I keep my medicine? This medicine is only given in a clinic, doctor's office, or other health care setting and will not be stored at home. NOTE: This sheet is a summary. It may not cover all possible information. If you have questions about this medicine, talk to your doctor, pharmacist, or health care provider.  2015, Elsevier/Gold Standard. (2011-06-27 12:37:47)  

## 2014-07-24 IMAGING — CR DG HIP 1V PORT*L*
1 series · 1 of 1 positions shown · non-contrast
Comparison: Portable pelvis 10/23/2012 [DATE] a.m..

CLINICAL DATA: Post hip replacement.

EXAM:
PORTABLE LEFT HIP - 1 VIEW

[AP]
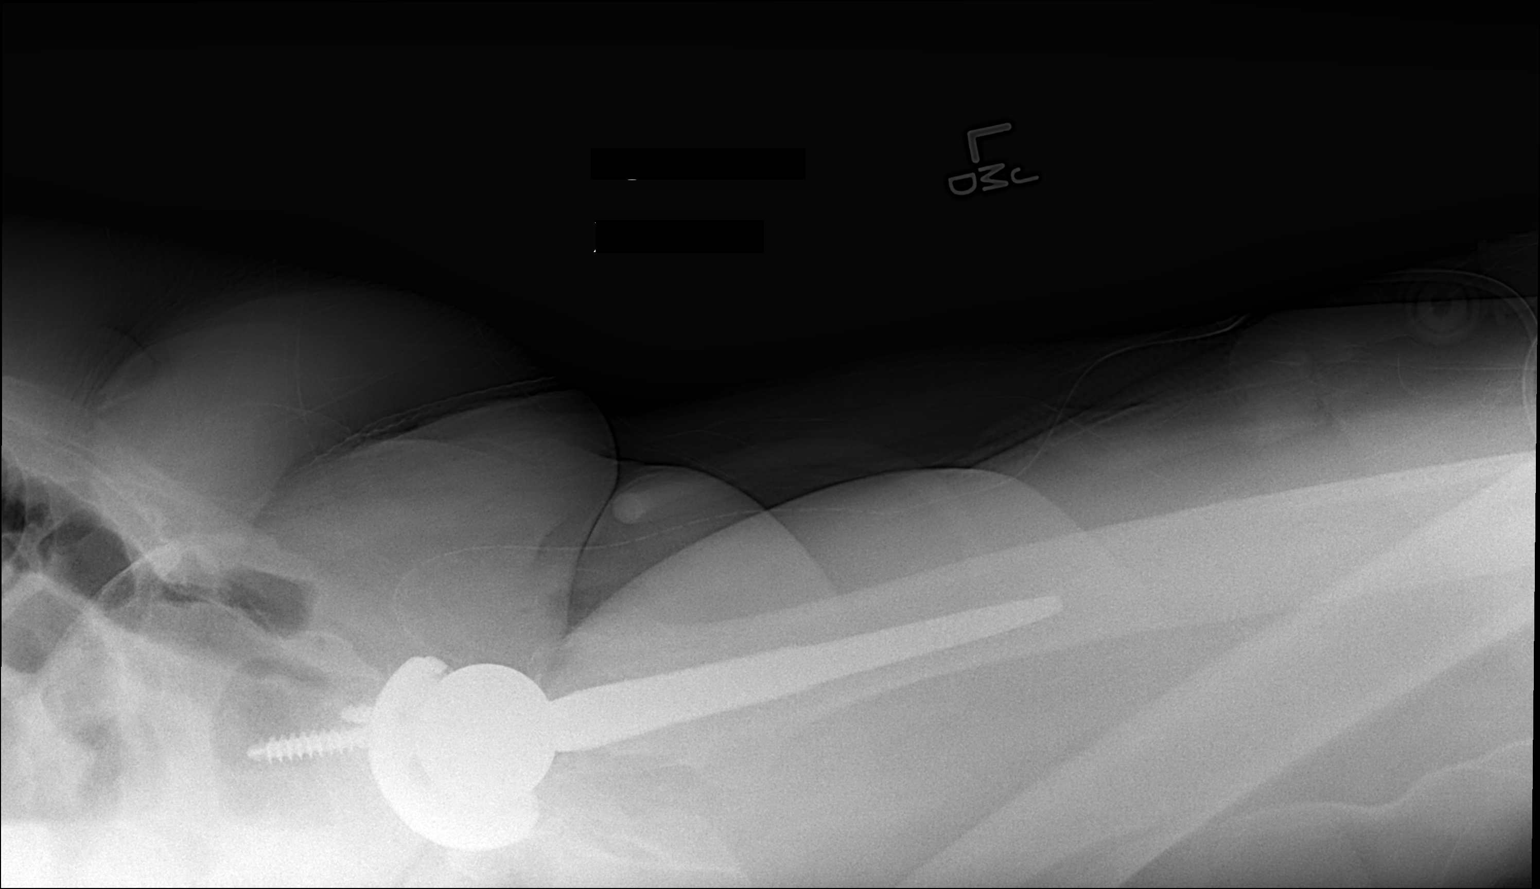

[1 of 1 positions shown; findings below may reference images not displayed]

FINDINGS: Left total hip prosthesis appears in satisfactory position without
complication noted. Surgical drain is in place.
IMPRESSION: Left total hip prosthesis appears in satisfactory position without
complication noted. Surgical drain is in place.

## 2014-10-01 ENCOUNTER — Encounter (HOSPITAL_COMMUNITY): Admission: RE | Admit: 2014-10-01 | Payer: Medicare Other | Source: Ambulatory Visit

## 2014-11-26 ENCOUNTER — Encounter: Payer: Self-pay | Admitting: Gastroenterology

## 2015-02-15 ENCOUNTER — Encounter (HOSPITAL_COMMUNITY): Payer: Self-pay

## 2015-02-15 ENCOUNTER — Inpatient Hospital Stay (HOSPITAL_COMMUNITY)
Admission: EM | Admit: 2015-02-15 | Discharge: 2015-02-17 | DRG: 682 | Disposition: A | Discharge status: Skilled Nursing Facility | Payer: Medicare Other | Attending: Internal Medicine | Admitting: Internal Medicine

## 2015-02-15 ENCOUNTER — Emergency Department (HOSPITAL_COMMUNITY): Payer: Medicare Other

## 2015-02-15 DIAGNOSIS — Z9181 History of falling: Secondary | ICD-10-CM | POA: Diagnosis not present

## 2015-02-15 DIAGNOSIS — G4730 Sleep apnea, unspecified: Secondary | ICD-10-CM | POA: Diagnosis present

## 2015-02-15 DIAGNOSIS — T39315A Adverse effect of propionic acid derivatives, initial encounter: Secondary | ICD-10-CM | POA: Diagnosis present

## 2015-02-15 DIAGNOSIS — I10 Essential (primary) hypertension: Secondary | ICD-10-CM | POA: Diagnosis present

## 2015-02-15 DIAGNOSIS — N390 Urinary tract infection, site not specified: Secondary | ICD-10-CM | POA: Diagnosis present

## 2015-02-15 DIAGNOSIS — Z91011 Allergy to milk products: Secondary | ICD-10-CM | POA: Diagnosis not present

## 2015-02-15 DIAGNOSIS — N179 Acute kidney failure, unspecified: Secondary | ICD-10-CM | POA: Diagnosis present

## 2015-02-15 DIAGNOSIS — D649 Anemia, unspecified: Secondary | ICD-10-CM | POA: Diagnosis present

## 2015-02-15 DIAGNOSIS — Z823 Family history of stroke: Secondary | ICD-10-CM

## 2015-02-15 DIAGNOSIS — E860 Dehydration: Secondary | ICD-10-CM | POA: Diagnosis present

## 2015-02-15 DIAGNOSIS — Z8249 Family history of ischemic heart disease and other diseases of the circulatory system: Secondary | ICD-10-CM

## 2015-02-15 DIAGNOSIS — G9341 Metabolic encephalopathy: Secondary | ICD-10-CM | POA: Diagnosis present

## 2015-02-15 DIAGNOSIS — Z885 Allergy status to narcotic agent status: Secondary | ICD-10-CM

## 2015-02-15 DIAGNOSIS — Z880 Allergy status to penicillin: Secondary | ICD-10-CM

## 2015-02-15 DIAGNOSIS — R54 Age-related physical debility: Secondary | ICD-10-CM | POA: Diagnosis present

## 2015-02-15 DIAGNOSIS — E039 Hypothyroidism, unspecified: Secondary | ICD-10-CM | POA: Diagnosis present

## 2015-02-15 DIAGNOSIS — Y92009 Unspecified place in unspecified non-institutional (private) residence as the place of occurrence of the external cause: Secondary | ICD-10-CM

## 2015-02-15 DIAGNOSIS — F329 Major depressive disorder, single episode, unspecified: Secondary | ICD-10-CM | POA: Diagnosis present

## 2015-02-15 DIAGNOSIS — W19XXXA Unspecified fall, initial encounter: Secondary | ICD-10-CM

## 2015-02-15 DIAGNOSIS — J449 Chronic obstructive pulmonary disease, unspecified: Secondary | ICD-10-CM | POA: Diagnosis present

## 2015-02-15 DIAGNOSIS — Z79899 Other long term (current) drug therapy: Secondary | ICD-10-CM | POA: Diagnosis not present

## 2015-02-15 DIAGNOSIS — T502X5A Adverse effect of carbonic-anhydrase inhibitors, benzothiadiazides and other diuretics, initial encounter: Secondary | ICD-10-CM | POA: Diagnosis present

## 2015-02-15 DIAGNOSIS — R4182 Altered mental status, unspecified: Secondary | ICD-10-CM | POA: Diagnosis present

## 2015-02-15 DIAGNOSIS — R011 Cardiac murmur, unspecified: Secondary | ICD-10-CM | POA: Diagnosis present

## 2015-02-15 DIAGNOSIS — R410 Disorientation, unspecified: Secondary | ICD-10-CM | POA: Diagnosis not present

## 2015-02-15 DIAGNOSIS — K219 Gastro-esophageal reflux disease without esophagitis: Secondary | ICD-10-CM | POA: Diagnosis present

## 2015-02-15 DIAGNOSIS — I959 Hypotension, unspecified: Secondary | ICD-10-CM | POA: Diagnosis present

## 2015-02-15 DIAGNOSIS — E861 Hypovolemia: Secondary | ICD-10-CM | POA: Diagnosis present

## 2015-02-15 DIAGNOSIS — Z96642 Presence of left artificial hip joint: Secondary | ICD-10-CM | POA: Diagnosis present

## 2015-02-15 DIAGNOSIS — M6282 Rhabdomyolysis: Secondary | ICD-10-CM | POA: Diagnosis present

## 2015-02-15 DIAGNOSIS — T501X5A Adverse effect of loop [high-ceiling] diuretics, initial encounter: Secondary | ICD-10-CM | POA: Diagnosis present

## 2015-02-15 DIAGNOSIS — Z85820 Personal history of malignant melanoma of skin: Secondary | ICD-10-CM | POA: Diagnosis not present

## 2015-02-15 DIAGNOSIS — R441 Visual hallucinations: Secondary | ICD-10-CM | POA: Diagnosis present

## 2015-02-15 LAB — CBC
HEMATOCRIT: 36.8 % (ref 36.0–46.0)
HEMOGLOBIN: 12.4 g/dL (ref 12.0–15.0)
MCH: 31.3 pg (ref 26.0–34.0)
MCHC: 33.7 g/dL (ref 30.0–36.0)
MCV: 92.9 fL (ref 78.0–100.0)
Platelets: 258 10*3/uL (ref 150–400)
RBC: 3.96 MIL/uL (ref 3.87–5.11)
RDW: 12.8 % (ref 11.5–15.5)
WBC: 11.5 10*3/uL — AB (ref 4.0–10.5)

## 2015-02-15 LAB — URINE MICROSCOPIC-ADD ON

## 2015-02-15 LAB — URINALYSIS, ROUTINE W REFLEX MICROSCOPIC
GLUCOSE, UA: NEGATIVE mg/dL
Hgb urine dipstick: NEGATIVE
KETONES UR: NEGATIVE mg/dL
NITRITE: NEGATIVE
PROTEIN: NEGATIVE mg/dL
Specific Gravity, Urine: 1.016 (ref 1.005–1.030)
pH: 5 (ref 5.0–8.0)

## 2015-02-15 LAB — COMPREHENSIVE METABOLIC PANEL
ALBUMIN: 3.8 g/dL (ref 3.5–5.0)
ALT: 15 U/L (ref 14–54)
ANION GAP: 14 (ref 5–15)
AST: 35 U/L (ref 15–41)
Alkaline Phosphatase: 57 U/L (ref 38–126)
BILIRUBIN TOTAL: 0.5 mg/dL (ref 0.3–1.2)
BUN: 21 mg/dL — ABNORMAL HIGH (ref 6–20)
CO2: 31 mmol/L (ref 22–32)
Calcium: 10.3 mg/dL (ref 8.9–10.3)
Chloride: 94 mmol/L — ABNORMAL LOW (ref 101–111)
Creatinine, Ser: 1.71 mg/dL — ABNORMAL HIGH (ref 0.44–1.00)
GFR calc Af Amer: 29 mL/min — ABNORMAL LOW (ref >60)
GFR, EST NON AFRICAN AMERICAN: 25 mL/min — AB (ref >60)
Glucose, Bld: 94 mg/dL (ref 65–99)
POTASSIUM: 3.6 mmol/L (ref 3.5–5.1)
Sodium: 139 mmol/L (ref 135–145)
TOTAL PROTEIN: 6.4 g/dL — AB (ref 6.5–8.1)

## 2015-02-15 LAB — PROTIME-INR
INR: 0.95 (ref 0.00–1.49)
PROTHROMBIN TIME: 12.9 s (ref 11.6–15.2)

## 2015-02-15 LAB — CK: Total CK: 294 U/L — ABNORMAL HIGH (ref 38–234)

## 2015-02-15 LAB — CBG MONITORING, ED: Glucose-Capillary: 80 mg/dL (ref 65–99)

## 2015-02-15 MED ORDER — SODIUM CHLORIDE 0.9 % IV SOLN: INTRAVENOUS | Status: DC

## 2015-02-15 MED ORDER — SODIUM CHLORIDE 0.9 % IV BOLUS (SEPSIS)
500.0000 mL | Freq: Once | INTRAVENOUS | Status: AC
  Administered 2015-02-15: 500 mL via INTRAVENOUS

## 2015-02-15 NOTE — ED Notes (Signed)
Patient transported to X-ray 

## 2015-02-15 NOTE — ED Notes (Signed)
Pt c/o lower back pain since falling out of bed yesterday. Pt states she has been having visual and auditory hallucination. Pts family member is also concerned because pt is not drinking and voiding as she routinely does. Pt A+OX4 in triage, rating lower back pain 6/10. Pt has hx of multiple back surgeries and hip replacements. Pt and family deny hx of alzheimer or dementia.

## 2015-02-15 NOTE — ED Notes (Signed)
Nurse starting IV, drawing labs 

## 2015-02-15 NOTE — ED Provider Notes (Signed)
CSN: [id number]     Arrival date & time 02/15/15  1919 History   First MD Initiated Contact with Patient 02/15/15 1948     Chief Complaint  Patient presents with  . Hallucinations  . Fall  . Dehydration  . Hypotension     (Consider location/radiation/quality/duration/timing/severity/associated sxs/prior Treatment) HPI Comments: Patient is a 82 year old female who lives at her home independently and has a history of multiple back surgeries on a fentanyl patch, hypertension, asthma, GERD who had a mechanical fall yesterday. She states that she discharged when she went to lay in her bed and she fell onto the floor. She was unable to get up and her family found her that evening it helped her into bed. They have noticed over the last 24 hours she has been having intermittent episodes of hallucinating with talking to people that are not there and having decreased by mouth intake and decreased urine. Patient denies headache but has had worsening pain in her back since the fall but is able to ambulate. She denies any nausea or vomiting. No abdominal pain or urinary symptoms. Family states that she recently was started on Wellbutrin and patient thought she only took 2 doses however when her granddaughter counted the pills today there were 7 missing which they think may also be adding into her symptoms.  Patient is unclear how long she was on the floor before her family found her but they state normally she is very sharp and does not have she is with dementia or memory problems.  Patient is a 82 y.o. female presenting with fall. The history is provided by the patient.  Fall This is a new problem. The current episode started yesterday. The problem occurs constantly. The problem has not changed since onset.   Past Medical History  Diagnosis Date  . Hypertension   . Asthma   . Hypothyroidism   . Constipation   . Blood transfusion 2010     1 unit after each back surgery  . Sleep apnea     stopbang=4    . Anxiety   . Depression   . GERD (gastroesophageal reflux disease)   . Arthritis    Past Surgical History  Procedure Laterality Date  . Whole back surgery for scioliosis   rods inserted ,with last surgery 2010    surgery done x 3 times--05/27/2005,05/2009,10/19/2009  . Right rotator cuff repair  09/07/1993  . Malignant melanoa removed from right shoulder   13yrs ago  . Melignant melanoma removed from near left shin  6 yrs ago  . Bunion removed from left foot  50 yrs ago  . Lens implants both eyes  15 yrs ago  . Total knee arthroplasty  04/04/2011    Procedure: TOTAL KNEE ARTHROPLASTY;  Surgeon: [name], MD;  Location: [facility];  Service: Orthopedics;  Laterality: Left;  . Tonsillectomy  1936  . Appendectomy  1956    with suspension of uterus  . Tubal ligation  yrs ago    40  . Hernia repair  yrs ago    1958  . Cholecystectomy  yrs ago    89  . Eye surgery  12/1995,01/1996    bilateral cararacts with lens implants  . Hiatal hernia repair  01/09/2004    large paraesophageal  . Abdominal hysterectomy  1964- age 80    partial  . Back surgery  05/27/2005    for lumbar scoliotic deformity  . Breast surgery  2010    right breast-benign  .  Anterior /posterior rectocele  07/12/1991    and ovarian cystectomy  . Total hip arthroplasty Left 10/23/2012    Procedure: LEFT TOTAL HIP ARTHROPLASTY ANTERIOR APPROACH;  Surgeon: [name], MD;  Location: [facility];  Service: Orthopedics;  Laterality: Left;   History reviewed. No pertinent family history. Social History  Substance Use Topics  . Smoking status: Never Smoker   . Smokeless tobacco: Never Used  . Alcohol Use: No   OB History    No data available     Review of Systems  All other systems reviewed and are negative.     Allergies  Codeine; Penicillins; and Sulfonamide derivatives  Home Medications   Prior to Admission medications   Medication Sig Start Date End Date Taking? Authorizing Provider  aspirin  81 MG tablet Take 81 mg by mouth daily.    Historical Provider, MD  benazepril-hydrochlorthiazide (LOTENSIN HCT) 20-12.5 MG per tablet Take 1 tablet by mouth every morning. 12/31/12   Creola Corn, MD  budesonide (PULMICORT) 180 MCG/ACT inhaler Inhale 2 puffs into the lungs at bedtime.    Historical Provider, MD  CALCIUM & MAGNESIUM CARBONATES PO Take 1,000 mg by mouth every morning.     Historical Provider, MD  cycloSPORINE (RESTASIS) 0.05 % ophthalmic emulsion Place 1 drop into both eyes every 12 (twelve) hours.    Historical Provider, MD  diazepam (VALIUM) 2 MG tablet Take 2 mg by mouth every 6 (six) hours as needed. For anxiety    Historical Provider, MD  docusate sodium 100 MG CAPS Take 100 mg by mouth 2 (two) times daily. 10/25/12   Lanney Gins, PA-C  feeding supplement, ENSURE COMPLETE, (ENSURE COMPLETE) LIQD Take 237 mLs by mouth daily. 12/24/12   Creola Corn, MD  fentaNYL (DURAGESIC - DOSED MCG/HR) 50 MCG/HR Place 1 patch onto the skin every 3 (three) days.    Historical Provider, MD  furosemide (LASIX) 40 MG tablet Take 40 mg by mouth daily as needed for edema.    Historical Provider, MD  gabapentin (NEURONTIN) 100 MG capsule Take 100 mg by mouth 2 (two) times daily.    Historical Provider, MD  hyoscyamine (LEVSIN SL) 0.125 MG SL tablet Place 0.125 mg under the tongue every 4 (four) hours as needed.    Historical Provider, MD  ipratropium (ATROVENT) 0.06 % nasal spray Place 2 sprays into the nose every morning.    Historical Provider, MD  levothyroxine (SYNTHROID, LEVOTHROID) 125 MCG tablet Take 125 mcg by mouth every morning.    Historical Provider, MD  meloxicam (MOBIC) 15 MG tablet Take 15 mg by mouth daily.    Historical Provider, MD  methocarbamol (ROBAXIN) 500 MG tablet Take 500 mg by mouth 4 (four) times daily.    Historical Provider, MD  Multiple Vitamin (MULITIVITAMIN WITH MINERALS) TABS Take 1 tablet by mouth every morning.    Historical Provider, MD  pantoprazole (PROTONIX) 40  MG tablet Take 40 mg by mouth daily.     Historical Provider, MD  polyethylene glycol (MIRALAX / GLYCOLAX) packet Take 17 g by mouth 2 (two) times daily. 10/25/12   Lanney Gins, PA-C  quiNINE (QUALAQUIN) 324 MG capsule Take 648 mg by mouth every 8 (eight) hours as needed. For muscle cramps in legs    Historical Provider, MD  traMADol (ULTRAM) 50 MG tablet Take by mouth every 6 (six) hours as needed.    Historical Provider, MD  traZODone (DESYREL) 50 MG tablet Take 50 mg by mouth at bedtime.  Historical Provider, MD  vitamin C (ASCORBIC ACID) 250 MG tablet Take 1,000 mg by mouth every morning.    Historical Provider, MD   BP 85/53 mmHg  Pulse 71  Temp(Src) 97.7 F (36.5 C) (Oral)  Resp 17  SpO2 94% Physical Exam  Constitutional: She is oriented to person, place, and time. She appears well-developed and well-nourished. No distress.  HENT:  Head: Normocephalic and atraumatic.  Mouth/Throat: Oropharynx is clear and moist. Mucous membranes are dry.  Eyes: Conjunctivae and EOM are normal. Pupils are equal, round, and reactive to light.  Neck: Normal range of motion. Neck supple.  Cardiovascular: Normal rate, regular rhythm and intact distal pulses.   No murmur heard. Pulmonary/Chest: Effort normal and breath sounds normal. No respiratory distress. She has no wheezes. She has no rales.  Abdominal: Soft. She exhibits no distension. There is no tenderness. There is no rebound and no guarding.  Musculoskeletal: Normal range of motion. She exhibits no edema.       Left hip: She exhibits tenderness.       Thoracic back: She exhibits tenderness.       Back:       Legs: Neurological: She is alert and oriented to person, place, and time.  Skin: Skin is warm and dry. No rash noted. No erythema.  Psychiatric: She has a normal mood and affect. Her behavior is normal.  Nursing note and vitals reviewed.   ED Course  Procedures (including critical care time) Labs Review Labs Reviewed    COMPREHENSIVE METABOLIC PANEL - Abnormal; Notable for the following:    Chloride 94 (*)    BUN 21 (*)    Creatinine, Ser 1.71 (*)    Total Protein 6.4 (*)    GFR calc non Af Amer 25 (*)    GFR calc Af Amer 29 (*)    All other components within normal limits  CBC - Abnormal; Notable for the following:    WBC 11.5 (*)    All other components within normal limits  URINALYSIS, ROUTINE W REFLEX MICROSCOPIC (NOT AT [facility]) - Abnormal; Notable for the following:    APPearance CLOUDY (*)    Bilirubin Urine SMALL (*)    Leukocytes, UA MODERATE (*)    All other components within normal limits  CK - Abnormal; Notable for the following:    Total CK 294 (*)    All other components within normal limits  URINE MICROSCOPIC-ADD ON - Abnormal; Notable for the following:    Squamous Epithelial / LPF 0-5 (*)    Bacteria, UA FEW (*)    Casts HYALINE CASTS (*)    All other components within normal limits  URINE CULTURE  PROTIME-INR  CBG MONITORING, ED    Imaging Review Dg Thoracic Spine W/swimmers  02/15/2015  CLINICAL DATA:  82 year old female with back pain after falling out of bed. History of prior back surgery. EXAM: THORACIC SPINE - 3 VIEWS COMPARISON:  Radiograph dated 12/23/2012 FINDINGS: No acute fracture or subluxation. The bones are osteopenic. There is mild compression deformity and anterior wedging of an upper thoracic vertebra similar to prior study. Thoracolumbar fixation hardware noted. IMPRESSION: No acute fracture or subluxation. Electronically Signed   By: Elgie Collard M.D.   On: 02/15/2015 20:54   Ct Head Wo Contrast  02/15/2015  CLINICAL DATA:  82 year old female with increased altered mental status and visual hallucinations. EXAM: CT HEAD WITHOUT CONTRAST TECHNIQUE: Contiguous axial images were obtained from the base of the skull through the vertex  without intravenous contrast. COMPARISON:  None. FINDINGS: The ventricles are dilated and the sulci are prominent compatible with  age-related atrophy. Moderate Periventricular and deep white matter hypodensities represent chronic microvascular ischemic changes. There is no intracranial hemorrhage. No mass effect or midline shift identified. There is mild mucoperiosteal thickening of the right maxillary sinus. The remainder of the paranasal sinuses for. Dilated. Multiple opacified mastoid air cells are seen bilaterally. The remainder of the visualized mastoid air cells are clear. The calvarium is intact. IMPRESSION: No acute intracranial hemorrhage. Age-related atrophy and chronic microvascular ischemic disease. If symptoms persist and there are no contraindications, MRI may provide better evaluation if clinically indicated. Electronically Signed   By: Elgie Collard M.D.   On: 02/15/2015 20:41   Dg Hip Infant Unilat With Pelvis 2-3 Views Left  02/15/2015  CLINICAL DATA:  Initial evaluation for acute left hip pain status post fall. EXAM: DG HIP (WITH OR WITHOUT PELVIS) INFANT 2-3V LEFT COMPARISON:  None. FINDINGS: Left hip arthroplasty in place. The acetabular and femoral components articulate normally with 1 another. No periprosthetic lucency to suggest loosening or infection. No fracture or dislocation. Limited views of the pelvis demonstrate no acute abnormality. Fixation hardware present within the lumbosacral spine. No acute soft tissue abnormality. IMPRESSION: 1. No acute fracture or dislocation. 2. Left total hip arthroplasty in place.  No hardware complication. Electronically Signed   By: Rise Mu M.D.   On: 02/15/2015 20:57   I have personally reviewed and evaluated these images and lab results as part of my medical decision-making.   EKG Interpretation None      MDM   Final diagnoses:  Fall    Patient is an 82 year old female who is well-appearing but presents today with family for a fall yesterday with back pain and symptoms suggestive of mild delirium. A she denies any syncope or head trauma. She  denies headache and currently is awake and alert but does not know the day or the president. Recently she had started on Wellbutrin and thought she only took 2 doses however her granddaughter discovered she has taken 7 pills which may be adding into some of her delirium as well as concern for possible dehydration as they state she is not been drinking much over the last few days and had a prolonged period of time where she did not urinate. Patient denies any abdominal pain, chest pain or shortness of breath. She thinks she took all of her medications today.  Patient's labs are consistent with a CBC with mild leucytosis of 11,000 and normal hemoglobin, CMP showing new AKA with a creatinine of 1.71 mild elevation of her CK to 259. Patient's urine shows moderate leukocytes with 6-30 white blood cells. Patient denies any urinary symptoms so we'll culture urine. Patient received 500 mL of fluid with some improvement in symptoms but when attempting to walk to the bathroom she still felt very dizzy. She states she would not of made it without having the nurse there to help her. Patient has been hypotensive here however did not have an appropriately fitting cough and when the new cuff was placed pressure is moderately low at 94/50. Unclear if patient is taking her medication appropriately and it is possible that she took additional blood pressure medication. Given her AKI, hallucination and borderline blood pressure will admit patient  Gwyneth Sprout, MD 02/15/15 [phone]

## 2015-02-16 ENCOUNTER — Inpatient Hospital Stay (HOSPITAL_COMMUNITY): Payer: Medicare Other

## 2015-02-16 ENCOUNTER — Encounter (HOSPITAL_COMMUNITY): Payer: Self-pay | Admitting: Family Medicine

## 2015-02-16 DIAGNOSIS — E039 Hypothyroidism, unspecified: Secondary | ICD-10-CM

## 2015-02-16 DIAGNOSIS — I10 Essential (primary) hypertension: Secondary | ICD-10-CM

## 2015-02-16 DIAGNOSIS — R4182 Altered mental status, unspecified: Secondary | ICD-10-CM | POA: Diagnosis present

## 2015-02-16 DIAGNOSIS — N179 Acute kidney failure, unspecified: Principal | ICD-10-CM

## 2015-02-16 DIAGNOSIS — R410 Disorientation, unspecified: Secondary | ICD-10-CM

## 2015-02-16 LAB — COMPREHENSIVE METABOLIC PANEL
ALT: 13 U/L — ABNORMAL LOW (ref 14–54)
AST: 23 U/L (ref 15–41)
Albumin: 2.9 g/dL — ABNORMAL LOW (ref 3.5–5.0)
Alkaline Phosphatase: 43 U/L (ref 38–126)
Anion gap: 8 (ref 5–15)
BUN: 24 mg/dL — ABNORMAL HIGH (ref 6–20)
CHLORIDE: 97 mmol/L — AB (ref 101–111)
CO2: 31 mmol/L (ref 22–32)
Calcium: 8.5 mg/dL — ABNORMAL LOW (ref 8.9–10.3)
Creatinine, Ser: 1.23 mg/dL — ABNORMAL HIGH (ref 0.44–1.00)
GFR, EST AFRICAN AMERICAN: 44 mL/min — AB (ref >60)
GFR, EST NON AFRICAN AMERICAN: 38 mL/min — AB (ref >60)
Glucose, Bld: 115 mg/dL — ABNORMAL HIGH (ref 65–99)
POTASSIUM: 3.4 mmol/L — AB (ref 3.5–5.1)
SODIUM: 136 mmol/L (ref 135–145)
Total Bilirubin: 0.2 mg/dL — ABNORMAL LOW (ref 0.3–1.2)
Total Protein: 4.8 g/dL — ABNORMAL LOW (ref 6.5–8.1)

## 2015-02-16 LAB — TSH: TSH: 0.298 u[IU]/mL — ABNORMAL LOW (ref 0.350–4.500)

## 2015-02-16 LAB — CBC
HEMATOCRIT: 30.9 % — AB (ref 36.0–46.0)
HEMOGLOBIN: 10.2 g/dL — AB (ref 12.0–15.0)
MCH: 31.8 pg (ref 26.0–34.0)
MCHC: 33 g/dL (ref 30.0–36.0)
MCV: 96.3 fL (ref 78.0–100.0)
Platelets: 209 10*3/uL (ref 150–400)
RBC: 3.21 MIL/uL — AB (ref 3.87–5.11)
RDW: 13.1 % (ref 11.5–15.5)
WBC: 6.8 10*3/uL (ref 4.0–10.5)

## 2015-02-16 LAB — BASIC METABOLIC PANEL
BUN: 22 mg/dL — AB (ref 4–21)
CREATININE: 0.6 mg/dL (ref 0.5–1.1)
SODIUM: 136 mmol/L — AB (ref 137–147)

## 2015-02-16 LAB — CBC AND DIFFERENTIAL: WBC: 6.8 10*3/mL

## 2015-02-16 LAB — CK: Total CK: 186 U/L (ref 38–234)

## 2015-02-16 LAB — HEPATIC FUNCTION PANEL: BILIRUBIN, TOTAL: 0.2 mg/dL

## 2015-02-16 MED ORDER — SODIUM CHLORIDE 0.9 % IV BOLUS (SEPSIS)
1000.0000 mL | Freq: Once | INTRAVENOUS | Status: AC
  Administered 2015-02-16: 1000 mL via INTRAVENOUS

## 2015-02-16 MED ORDER — GABAPENTIN 100 MG PO CAPS
100.0000 mg | ORAL_CAPSULE | Freq: Two times a day (BID) | ORAL | Status: DC
  Administered 2015-02-16 – 2015-02-17 (×4): 100 mg via ORAL
  Filled 2015-02-16 (×4): qty 1

## 2015-02-16 MED ORDER — ENOXAPARIN SODIUM 40 MG/0.4ML ~~LOC~~ SOLN
40.0000 mg | SUBCUTANEOUS | Status: DC
Start: 2015-02-16 — End: 2015-02-16

## 2015-02-16 MED ORDER — ENOXAPARIN SODIUM 30 MG/0.3ML ~~LOC~~ SOLN
30.0000 mg | Freq: Every day | SUBCUTANEOUS | Status: DC
Start: 2015-02-16 — End: 2015-02-17
  Administered 2015-02-16 – 2015-02-17 (×4): 30 mg via SUBCUTANEOUS
  Filled 2015-02-16 (×2): qty 0.3

## 2015-02-16 MED ORDER — ENSURE ENLIVE PO LIQD
237.0000 mL | Freq: Two times a day (BID) | ORAL | Status: DC
  Administered 2015-02-16 – 2015-02-17 (×2): 237 mL via ORAL

## 2015-02-16 MED ORDER — LEVOTHYROXINE SODIUM 25 MCG PO TABS
125.0000 ug | ORAL_TABLET | Freq: Every day | ORAL | Status: DC
  Administered 2015-02-16 – 2015-02-17 (×2): 125 ug via ORAL
  Filled 2015-02-16 (×2): qty 1

## 2015-02-16 MED ORDER — FENTANYL 50 MCG/HR TD PT72
50.0000 ug | MEDICATED_PATCH | TRANSDERMAL | Status: DC
  Administered 2015-02-16: 50 ug via TRANSDERMAL
  Filled 2015-02-16: qty 1

## 2015-02-16 MED ORDER — ENSURE ENLIVE PO LIQD
237.0000 mL | ORAL | Status: DC
  Administered 2015-02-16: 237 mL via ORAL
  Filled 2015-02-16: qty 237

## 2015-02-16 MED ORDER — PANTOPRAZOLE SODIUM 40 MG PO TBEC
40.0000 mg | DELAYED_RELEASE_TABLET | Freq: Every day | ORAL | Status: DC
Start: 2015-02-16 — End: 2015-02-17
  Administered 2015-02-16 – 2015-02-17 (×2): 40 mg via ORAL
  Filled 2015-02-16 (×2): qty 1

## 2015-02-16 MED ORDER — CIPROFLOXACIN IN D5W 400 MG/200ML IV SOLN
400.0000 mg | INTRAVENOUS | Status: DC
  Administered 2015-02-16 – 2015-02-17 (×2): 400 mg via INTRAVENOUS
  Filled 2015-02-16 (×2): qty 200

## 2015-02-16 MED ORDER — ACETAMINOPHEN 650 MG RE SUPP: 650.0000 mg | Freq: Four times a day (QID) | RECTAL | Status: DC | PRN

## 2015-02-16 MED ORDER — SENNOSIDES-DOCUSATE SODIUM 8.6-50 MG PO TABS: 1.0000 | ORAL_TABLET | Freq: Every evening | ORAL | Status: DC | PRN

## 2015-02-16 MED ORDER — SODIUM CHLORIDE 0.9 % IV SOLN
INTRAVENOUS | Status: DC
  Administered 2015-02-16: 01:00:00 via INTRAVENOUS
  Administered 2015-02-16: 1000 mL via INTRAVENOUS

## 2015-02-16 MED ORDER — ACETAMINOPHEN 325 MG PO TABS
650.0000 mg | ORAL_TABLET | Freq: Four times a day (QID) | ORAL | Status: DC | PRN
  Administered 2015-02-17: 650 mg via ORAL
  Filled 2015-02-16: qty 2

## 2015-02-16 MED ORDER — SODIUM CHLORIDE 0.9 % IV SOLN
Freq: Once | INTRAVENOUS | Status: AC
  Administered 2015-02-16: via INTRAVENOUS

## 2015-02-16 MED ORDER — POTASSIUM CHLORIDE CRYS ER 20 MEQ PO TBCR
40.0000 meq | EXTENDED_RELEASE_TABLET | Freq: Once | ORAL | Status: AC
  Administered 2015-02-16: 40 meq via ORAL
  Filled 2015-02-16: qty 2

## 2015-02-16 NOTE — Progress Notes (Signed)
Patient Demographics  Meghan Sexton, is a 82 y.o. female, DOB - August 26, 1923, UUV:[id number]  Admit date - 02/15/2015   Admitting Physician Alberteen Sam, MD  Outpatient Primary MD for the patient is Gwen Pounds, MD  LOS - 1   Chief Complaint  Patient presents with  . Hallucinations  . Fall  . Dehydration  . Hypotension         Subjective:   Meghan Sexton today has, No headache, No chest pain, No abdominal pain - No Nausea, complaints of lower back pain  Assessment & Plan    Principal Problem:   Acute kidney injury (HCC) Active Problems:   Essential hypertension   Hypothyroidism   Anemia   Altered mental status   Altered mental state  Altered mental status - Most likely due to dehydration and UTI, continue with gentle hydration and UTI treatment   AKI - This is likely related to some hypotension/hypovolemia in context of ACEi and furosemide use, also possibly NSAIDs, which patient has prescribed. - Continue to hold ACEI and  Furosemide - continue gentle hydration   UTI  - Continue ciprofloxacin IV, follow on urine culture    HTN - Relative hypotension at admission. - Hold HCTZ-benazepril and furosemide   Hypothyroidism  - Continue levothyroxine - Check TSH  Elevated CK/mild rhabdomyolysis   - She reports she was on the floor for few hours , continue gentle hydration   Medication reconciliation: Patient somewhat vague at this time of what medicines she takes, and son produces picture on phone of her home medicines, but lacks bupropion, levothyroxine, fentanyl, which patient clearly takes. -Call to patient's pharmacy to confirm medication reconciliation.   New murmur No previous echo. Patient unaware of murmur. - Echocardiogram as outpatient   Code Status: full  Family Communication: none at bedside  Disposition Plan: pending PT   Procedures   none   Consults   none   Medications  Scheduled Meds: . ciprofloxacin  400 mg Intravenous Q24H  . enoxaparin (LOVENOX) injection  30 mg Subcutaneous Daily  . feeding supplement (ENSURE ENLIVE)  237 mL Oral BID BM  . fentaNYL  50 mcg Transdermal Q72H  . gabapentin  100 mg Oral BID  . levothyroxine  125 mcg Oral QAC breakfast  . pantoprazole  40 mg Oral Daily   Continuous Infusions: . sodium chloride 1,000 mL (02/16/15 1133)   PRN Meds:.acetaminophen **OR** acetaminophen, senna-docusate  DVT Prophylaxis  Lovenox   Lab Results  Component Value Date   PLT 209 02/16/2015    Antibiotics    Anti-infectives    Start     Dose/Rate Route Frequency Ordered Stop   02/16/15 1000  ciprofloxacin (CIPRO) IVPB 400 mg     400 mg 200 mL/hr over 60 Minutes Intravenous Every 24 hours 02/16/15 0735            Objective:   Filed Vitals:   02/16/15 0015 02/16/15 0122 02/16/15 0428 02/16/15 1420  BP: 102/46 91/43 85/42  98/44  Pulse: 75 72 75 65  Temp:  98 F (36.7 C) 97.8 F (36.6 C) 97.9 F (36.6 C)  TempSrc:  Oral Oral Oral  Resp: Height:   (1.422 m)    Weight:  52.617 kg (116 lb)    SpO2: 95% 95% 93% 100%    Wt Readings from Last 3 Encounters:  02/16/15 52.617 kg (116 lb)  03/26/14 52.164 kg (115 lb)  12/20/12 58.151 kg (128 lb 3.2 oz)     Intake/Output Summary (Last 24 hours) at 02/16/15 1526 Last data filed at 02/16/15 1300  Gross per 24 hour  Intake 2608.33 ml  Output    340 ml  Net 2268.33 ml     Physical Exam  Awake Alert, Oriented X 3,  .AT,PERRAL Supple Neck,No JVD, Symmetrical Chest wall movement, Good air movement bilaterally, CTAB RRR,No Gallops,Rubs , No Parasternal Heave, SEM + +ve B.Sounds, Abd Soft, No tenderness,  No rebound - guarding or rigidity. No Cyanosis, Clubbing or edema, No new Rash or bruise     Data Review   Micro Results No results found for this or any previous visit (from the past 240  hour(s)).  Radiology Reports Dg Thoracic Spine 2 View  02/16/2015  CLINICAL DATA:  Back pain after falling out of bed several days ago. EXAM: THORACIC SPINE 2 VIEWS COMPARISON:  February 15, 2015. FINDINGS: Status post extensive surgical posterior fusion extending from mid thoracic spine into lumbar spine which is unchanged compared to prior exam. Stable moderate compression deformity of T5 vertebral body. No acute fracture or spondylolisthesis is noted. IMPRESSION: Extensive postsurgical changes as described above. Multi 5 compression deformity is noted. No acute abnormality seen. Electronically Signed   By: Lupita Raider, M.D.   On: 02/16/2015 13:46   Dg Thoracic Spine W/swimmers  02/15/2015  CLINICAL DATA:  82 year old female with back pain after falling out of bed. History of prior back surgery. EXAM: THORACIC SPINE - 3 VIEWS COMPARISON:  Radiograph dated 12/23/2012 FINDINGS: No acute fracture or subluxation. The bones are osteopenic. There is mild compression deformity and anterior wedging of an upper thoracic vertebra similar to prior study. Thoracolumbar fixation hardware noted. IMPRESSION: No acute fracture or subluxation. Electronically Signed   By: Elgie Collard M.D.   On: 02/15/2015 20:54   Dg Lumbar Spine 2-3 Views  02/16/2015  CLINICAL DATA:  Left-sided low back pain after falling out of bed several days ago. EXAM: LUMBAR SPINE - 2-3 VIEW COMPARISON:  November 08, 2004. FINDINGS: Status post surgical posterior fusion extending from thoracic spine and through lumbar spine into S1 and S2. Good alignment of vertebral bodies is noted. No acute fracture or spondylolisthesis is noted. Degenerative disc disease is noted at multiple levels. IMPRESSION: Extensive postsurgical changes as described above. No acute abnormality seen. Electronically Signed   By: Lupita Raider, M.D.   On: 02/16/2015 13:48   Dg Pelvis 1-2 Views  02/16/2015  CLINICAL DATA:  Left hip pain after falling out of bed several  days ago. EXAM: PELVIS - 1-2 VIEW COMPARISON:  October 23, 2012. FINDINGS: There is no evidence of pelvic fracture or diastasis. No pelvic bone lesions are seen. Status post left hip arthroplasty. Surgical posterior fusion of lumbar spine and upper sacrum is noted. IMPRESSION: No acute abnormality seen in the pelvis. Extensive postsurgical changes as described above. Electronically Signed   By: Lupita Raider, M.D.   On: 02/16/2015 13:49   Ct Head Wo Contrast  02/15/2015  CLINICAL DATA:  82 year old female with increased altered mental status and visual hallucinations. EXAM: CT HEAD WITHOUT CONTRAST TECHNIQUE: Contiguous axial images were obtained from the base of the skull through the vertex without intravenous contrast. COMPARISON:  None. FINDINGS: The  ventricles are dilated and the sulci are prominent compatible with age-related atrophy. Moderate Periventricular and deep white matter hypodensities represent chronic microvascular ischemic changes. There is no intracranial hemorrhage. No mass effect or midline shift identified. There is mild mucoperiosteal thickening of the right maxillary sinus. The remainder of the paranasal sinuses for. Dilated. Multiple opacified mastoid air cells are seen bilaterally. The remainder of the visualized mastoid air cells are clear. The calvarium is intact. IMPRESSION: No acute intracranial hemorrhage. Age-related atrophy and chronic microvascular ischemic disease. If symptoms persist and there are no contraindications, MRI may provide better evaluation if clinically indicated. Electronically Signed   By: Elgie Collard M.D.   On: 02/15/2015 20:41   Dg Hip Infant Unilat With Pelvis 2-3 Views Left  02/15/2015  CLINICAL DATA:  Initial evaluation for acute left hip pain status post fall. EXAM: DG HIP (WITH OR WITHOUT PELVIS) INFANT 2-3V LEFT COMPARISON:  None. FINDINGS: Left hip arthroplasty in place. The acetabular and femoral components articulate normally with 1 another. No  periprosthetic lucency to suggest loosening or infection. No fracture or dislocation. Limited views of the pelvis demonstrate no acute abnormality. Fixation hardware present within the lumbosacral spine. No acute soft tissue abnormality. IMPRESSION: 1. No acute fracture or dislocation. 2. Left total hip arthroplasty in place.  No hardware complication. Electronically Signed   By: Rise Mu M.D.   On: 02/15/2015 20:57     CBC  Recent Labs Lab 02/15/15 1959 02/16/15 0448  WBC 11.5* 6.8  HGB 12.4 10.2*  HCT 36.8 30.9*  PLT 258 209  MCV 92.9 96.3  MCH 31.3 31.8  MCHC 33.7 33.0  RDW 12.8 13.1    Chemistries   Recent Labs Lab 02/15/15 1959 02/16/15 0448  NA 139 136  K 3.6 3.4*  CL 94* 97*  CO2 31 31  GLUCOSE 94 115*  BUN 21* 24*  CREATININE 1.71* 1.23*  CALCIUM 10.3 8.5*  AST 35 23  ALT 15 13*  ALKPHOS 57 43  BILITOT 0.5 0.2*   ------------------------------------------------------------------------------------------------------------------ estimated creatinine clearance is 21 mL/min (by C-G formula based on Cr of 1.23). ------------------------------------------------------------------------------------------------------------------ No results for input(s): HGBA1C in the last 72 hours. ------------------------------------------------------------------------------------------------------------------ No results for input(s): CHOL, HDL, LDLCALC, TRIG, CHOLHDL, LDLDIRECT in the last 72 hours. ------------------------------------------------------------------------------------------------------------------  Recent Labs  02/16/15 0448  TSH 0.298*   ------------------------------------------------------------------------------------------------------------------ No results for input(s): VITAMINB12, FOLATE, FERRITIN, TIBC, IRON, RETICCTPCT in the last 72 hours.  Coagulation profile  Recent Labs Lab 02/15/15 1959  INR 0.95    No results for input(s): DDIMER  in the last 72 hours.  Cardiac Enzymes No results for input(s): CKMB, TROPONINI, MYOGLOBIN in the last 168 hours.  Invalid input(s): CK ------------------------------------------------------------------------------------------------------------------ Invalid input(s): POCBNP     Time Spent in minutes   No charge   [facility], Aaralynn Shepheard M.D on 02/16/2015 at 3:26 PM  Between 7am to 7pm - Pager - [phone]  After 7pm go to [email or web address] - password [email or web address]  [facility]   Office  [phone]

## 2015-02-16 NOTE — Progress Notes (Signed)
Pt down to xray

## 2015-02-16 NOTE — Progress Notes (Signed)
Initial Nutrition Assessment  DOCUMENTATION CODES:   Non-severe (moderate) malnutrition in context of acute illness/injury  INTERVENTION:   - Provide patient with Ensure Enlive po BID, each supplement contains 350 kcals and 20 grams of protein.  - Adjust regular diet to include chopped meats. - Recommend Speech Evaluation if swallowing issues persist.  NUTRITION DIAGNOSIS:   Malnutrition related to acute illness as evidenced by mild depletion of body fat, moderate depletions of muscle mass.  GOAL:   Patient will meet greater than or equal to 90% of their needs  MONITOR:   PO intake, Supplement acceptance, Weight trends, Labs  REASON FOR ASSESSMENT:   Malnutrition Screening Tool   ASSESSMENT:   82 y.o. female with a past medical history significant for HTN, COPD who presents with fall and confusion.  Patient reports usually having a poor appetite.  Prior to admission, she consumes egg/toast, sandwiches, and pot pies.  Patient consumes about 1 Ensure per day at home. Patient consumed very little of her breakfast this morning, 30% meal completion per chart review.  Nurse stated that patient said it was much earlier than she normally eats. Patient had consumed an orange sherbet ice cream, coffee, and was given a strawberry Ensure during the visit.   Patient reports having trouble swallowing, especially with meats.  She reports that she takes small bites of food making it easier for her to swallow. Patient is able to cut of meats herself but would prefer chopped meats with her meals. Patient may benefit from Speech Evaluation.  Patients reports usual body weight of 115#.  Believes she used to weight 140#'s at this time last year; however, per weight history patient weight has been stable over the past year.   Nutrition Focused Physical Exam was completed. Findings include mild fat depletion, moderate muscle wasting, and no edema.   Labs reviewed: low potassium, elevated BUN and  creatinine  Meds reviewed: protonix, senokot  Diet Order:  Diet regular Room service appropriate?: Yes; Fluid consistency:: Thin  Skin:  Reviewed, no issues  Last BM:  02/15/15  Height:   Ht Readings from Last 1 Encounters:  02/16/15  (1.422 m)   Weight:   Wt Readings from Last 1 Encounters:  02/16/15 116 lb (52.617 kg)   Ideal Body Weight:  42.4 kg  BMI:  Body mass index is 26.02 kg/(m^2).  Estimated Nutritional Needs:   Kcal:  1250-1450  Protein:  60-70 grams  Fluid:  1.3-1.5 L  EDUCATION NEEDS:   No education needs identified at this time  Doroteo Glassman, Dietetic Intern Pager: [phone]

## 2015-02-16 NOTE — Progress Notes (Signed)
OT Cancellation Note  Patient Details Name: Meghan Sexton MRN: [id number] DOB: 12-06-1923   Cancelled Treatment:    Reason Eval/Treat Not Completed: Fatigue/lethargy limiting ability to participate  Pt agreed for OT to check on her in the morning.    Alba Cory 02/16/2015, 4:27 PM

## 2015-02-16 NOTE — Evaluation (Signed)
Physical Therapy Evaluation Patient Details Name: [facility] MRN: [id number] DOB: 11/20/1923 Today's Date: 02/16/2015   History of Present Illness  Meghan Se[facility] is a 82 y.o. female with a past medical history significant for HTN, COPD who presents with fall and confusion.PMH: L THA, L TKA, multiple back surgeries;  Clinical Impression  Pt admitted with above diagnosis. Pt currently with functional limitations due to the deficits listed below (see PT Problem List).   Pt will benefit from skilled PT to increase their independence and safety with mobility to allow discharge to the venue listed below.   Pt reports she feels very "weak in her legs"; recommend SNF, will follow; painful, erythematous area over spine around ~T8, RN made aware     Follow Up Recommendations SNF;Supervision/Assistance - 24 hour    Equipment Recommendations  None recommended by PT    Recommendations for Other Services       Precautions / Restrictions Precautions Precautions: Fall Precaution Comments: pt reports this is her first fall that she can recall Restrictions Weight Bearing Restrictions: No      Mobility  Bed Mobility Overal bed mobility: Needs Assistance Bed Mobility: Supine to Sit     Supine to sit: Min guard;Min assist     General bed mobility comments: bed pad used/assist to scoot to EOB; incr time and effortful transition   Transfers Overall transfer level: Needs assistance Equipment used: Rolling walker (2 wheeled) Transfers: Sit to/from Stand Sit to Stand: Min assist;Min guard         General transfer comment: assist to rise, stabilize in standing  Ambulation/Gait Ambulation/Gait assistance: Min assist Ambulation Distance (Feet): 15 Feet (x2) Assistive device: Rolling walker (2 wheeled) Gait Pattern/deviations: Step-through pattern;Decreased stride length;Shuffle;Trunk flexed;Wide base of support;Decreased stance time - left Gait velocity: decr   General Gait  Details: multi-modal cues for RW position adn safety, step length, trunk extension  Stairs            Wheelchair Mobility    Modified Rankin (Stroke Patients Only)       Balance Overall balance assessment: Needs assistance;History of Falls Sitting-balance support: No upper extremity supported;Feet supported Sitting balance-Leahy Scale: Fair (at least)     Standing balance support: Single extremity supported;During functional activity;No upper extremity supported Standing balance-Leahy Scale: Poor                 High Level Balance Comments: pt requires assist to maintain balance during LB clothing manipulation             Pertinent Vitals/Pain Pain Assessment: No/denies pain    Home Living Family/patient expects to be discharged to:: Private residence Living Arrangements: Alone   Type of Home: House Home Access: Level entry     Home Layout: One level Home Equipment: Environmental consultant - 2 wheels;Cane - single point      Prior Function Level of Independence: Independent with assistive device(s)         Comments: amb with RW     Hand Dominance        Extremity/Trunk Assessment   Upper Extremity Assessment: Generalized weakness           Lower Extremity Assessment: LLE deficits/detail   LLE Deficits / Details: hip flexion 2+/5, hip abduction and extension 3/5, knee 3 to 3+/5; AAROM grossly WFL     Communication   Communication: HOH  Cognition Arousal/Alertness: Awake/alert Behavior During Therapy: WFL for tasks assessed/performed Overall Cognitive Status:  (pt seems mildly confused at times  although she is Ox4)                      General Comments      Exercises        Assessment/Plan    PT Assessment Patient needs continued PT services  PT Diagnosis Difficulty walking;Generalized weakness   PT Problem List Decreased strength;Decreased activity tolerance;Decreased balance;Decreased mobility;Decreased safety awareness  PT  Treatment Interventions DME instruction;Gait training;Functional mobility training;Therapeutic activities;Therapeutic exercise;Patient/family education   PT Goals (Current goals can be found in the Care Plan section) Acute Rehab PT Goals Patient Stated Goal: to walk without walker PT Goal Formulation: With patient Time For Goal Achievement: 03/02/15 Potential to Achieve Goals: Good    Frequency Min 3X/week   Barriers to discharge        Co-evaluation               End of Session Equipment Utilized During Treatment: Gait belt Activity Tolerance: Patient tolerated treatment well Patient left: in chair;with call bell/phone within reach Nurse Communication: Other (comment) (RN notified of red and painful area  pt spine ~T8)         Time: 1610-9604 PT Time Calculation (min) (ACUTE ONLY): 26 min   Charges:   PT Evaluation $PT Eval Low Complexity: 1 Procedure PT Treatments $Gait Training: 8-22 mins   PT G Codes:        Hera Celaya Feb 21, 2015, 11:05 AM

## 2015-02-16 NOTE — H&P (Signed)
History and Physical  Patient Name: Meghan Sexton     WGN:[id number]    DOB: [birth year]    DOA: 02/15/2015 Referring physician: Gwyneth Sprout, MD PCP: Gwen Pounds, MD      Chief Complaint: Fall and altered mental status  HPI: [name] is a 82 y.o. female with a past medical history significant for HTN, COPD who presents with fall and confusion.  The patient is accompanied by her son who corroborates the history. They report that she lives independently, but Riley Nearing  son has been noticing more "strange behavior" and seeing or hearing things that aren't there over the last several days, he thinks since starting bupropion for depression.  Saturday, she wouldn't answer her phone so he went over to her house in the evening and found her on the floor in her bedroom. She reports that she had fallen and was laying there an unknown length of time (possibly >6-8 hrs). Today, she continued to see and hear things that weren't there complained of aches in her back, and so her family brought her to the ER.  In the ED, she was mildly hypotensive, K3.6, HCO3 31, Cr 1.71 (up from a baseline of 0.532 years ago), WBC 11.5, hemoglobin 12, CK 294, INR normal. The urinalysis showed casts, and no significant pyuria or bacteriuria.  A CT head, and radiographs of the back and hips were unremarkable. Fluids were administered and TRH were asked to evaluate for admission.     Review of Systems:  Pt complains of mild back pain, similar to chronic pain, recent fall, hallucinations, leg swelling, chronic shortness of breath. Pt denies any fever, chills, passing out, seizures, abdominal pain, nausea, vomiting, dysuria, hematuria, urinary urgency, urinary frequency, suprapubic pain, bleeding.  All other systems negative except as just noted or noted in the history of present illness.  Allergies  Allergen Reactions  . Codeine Nausea And Vomiting  . Lactose Nausea And Vomiting    GI upset - can tolerate some milk  products  . Penicillins Other (See Comments)    Nodules in legs Has patient had a PCN reaction causing immediate rash, facial/tongue/throat swelling, SOB or lightheadedness with hypotension: No Has patient had a PCN reaction causing severe rash involving mucus membranes or skin necrosis: No Has patient had a PCN reaction that required hospitalization No Has patient had a PCN reaction occurring within the last 10 years: No If all of the above answers are "NO", then may proceed with Cephalosporin use.  . Sulfonamide Derivatives Nausea Only    Prior to Admission medications   Medication Sig Start Date End Date Taking? Authorizing Provider  benazepril-hydrochlorthiazide (LOTENSIN HCT) 20-12.5 MG per tablet Take 1 tablet by mouth every morning. 12/31/12  Yes Creola Corn, MD  cycloSPORINE (RESTASIS) 0.05 % ophthalmic emulsion Place 1 drop into both eyes every 12 (twelve) hours. Patient has not had in a few months but says she needs some now.   Yes Historical Provider, MD  diazepam (VALIUM) 2 MG tablet Take 2 mg by mouth every 6 (six) hours as needed. For anxiety   Yes Historical Provider, MD  fentaNYL (DURAGESIC - DOSED MCG/HR) 50 MCG/HR Place 1 patch onto the skin every 3 (three) days. DUE FOR ONE NOW   Yes Historical Provider, MD  furosemide (LASIX) 40 MG tablet Take 40 mg by mouth daily as needed for edema.   Yes Historical Provider, MD  gabapentin (NEURONTIN) 100 MG capsule Take 100 mg by mouth 2 (two) times daily.  Yes Historical Provider, MD  HYDROcodone-acetaminophen (NORCO) 7.5-325 MG tablet Take 1 tablet by mouth every 6 (six) hours as needed for moderate pain.   Yes Historical Provider, MD  levothyroxine (SYNTHROID, LEVOTHROID) 125 MCG tablet Take 125 mcg by mouth every morning.   Yes Historical Provider, MD  pantoprazole (PROTONIX) 40 MG tablet Take 40 mg by mouth daily.    Yes Historical Provider, MD  docusate sodium 100 MG CAPS Take 100 mg by mouth 2 (two) times daily. Patient not  taking: Reported on 02/16/2015 10/25/12   Lanney Gins, PA-C  feeding supplement, ENSURE COMPLETE, (ENSURE COMPLETE) LIQD Take 237 mLs by mouth daily. Patient not taking: Reported on 02/16/2015 12/24/12   Creola Corn, MD  polyethylene glycol [facility]) packet Take 17 g by mouth 2 (two) times daily. Patient not taking: Reported on 02/16/2015 10/25/12   Lanney Gins, PA-C    Past Medical History  Diagnosis Date  . Hypertension   . Asthma   . Hypothyroidism   . Constipation   . Blood transfusion 2010     1 unit after each back surgery  . Sleep apnea     stopbang=4  . Anxiety   . Depression   . GERD (gastroesophageal reflux disease)   . Arthritis     Past Surgical History  Procedure Laterality Date  . Whole back surgery for scioliosis   rods inserted ,with last surgery 2010    surgery done x 3 times--05/27/2005,05/2009,10/19/2009  . Right rotator cuff repair  09/07/1993  . Malignant melanoa removed from right shoulder   61yrs ago  . Melignant melanoma removed from near left shin  6 yrs ago  . Bunion removed from left foot  50 yrs ago  . Lens implants both eyes  15 yrs ago  . Total knee arthroplasty  04/04/2011    Procedure: TOTAL KNEE ARTHROPLASTY;  Surgeon: [name], MD;  Location: [facility];  Service: Orthopedics;  Laterality: Left;  . Tonsillectomy  1936  . Appendectomy  1956    with suspension of uterus  . Tubal ligation  yrs ago    54  . Hernia repair  yrs ago    1958  . Cholecystectomy  yrs ago    76  . Eye surgery  12/1995,01/1996    bilateral cararacts with lens implants  . Hiatal hernia repair  01/09/2004    large paraesophageal  . Abdominal hysterectomy  1964- age 84    partial  . Back surgery  05/27/2005    for lumbar scoliotic deformity  . Breast surgery  2010    right breast-benign  . Anterior /posterior rectocele  07/12/1991    and ovarian cystectomy  . Total hip arthroplasty Left 10/23/2012    Procedure: LEFT TOTAL HIP ARTHROPLASTY  ANTERIOR APPROACH;  Surgeon: [name], MD;  Location: [facility];  Service: Orthopedics;  Laterality: Left;    Family history: family history includes Hypertension in her mother; Stroke in her mother.  Social History: Patient lives alone. She has someone who comes to clean the house, but otherwise manages her own medicines, prepares her own meals, and performs all ADLs independently. She is not a smoker. She walks with a walker, and is too frail to climb stairs at baseline.       Physical Exam: BP 102/46 mmHg  Pulse 72  Temp(Src) 98 F (36.7 C) (Oral)  Resp 16  Ht  (1.422 m)  Wt 52.617 kg (116 lb)  BMI 26.02 kg/m2  SpO2 95% General appearance: Frail elderly female, awake and in no acute distress.   Eyes: Anicteric, conjunctiva pink, lids and lashes normal.     ENT: No nasal deformity, discharge, or epistaxis.  OP moist without lesions.   Lymph: No cervical or supraclavicular lymphadenopathy. Skin: Warm and dry.  No jaundice.  No suspicious rashes, bruises, or lesions. Cardiac: RRR, nl S1-S2, 2-3/6 systolic ejection murmur, patient unaware.  Capillary refill is brisk.  No JVD, no pitting LE edema.  Radial pulses 2+ and symmetric. Respiratory: Normal respiratory rate and rhythm.  CTAB without rales or wheezes. Abdomen: Abdomen soft without rigidity or guarding.  No ascites, distension.   MSK: No deformities or effusions. Neuro: Cranial nerves 3-12 intact.  Left leg chronically weak, 3/5 strength, but right leg, both UEs 4+/5.  Oriented to person, place, year (after 2 or three tries), situation.  Memory poor of details like what medicines she takes, how long she was on the ground at home last night.  Attention appears normal.  Speech is fluent.      Psych: Behavior appropriate.  Affect normal.  No evidence of aural or visual hallucinations or delusions.       Labs on Admission:  The metabolic panel shows normal sodium, potassium. Mildly elevated bicarbonate (suspect  contraction alkalosis). Serum creatinine 1.71 mg/dL from a baseline of 1.32 mg/dL 2 years ago. The transaminases and bilirubin are normal. CK is 294 U/L. INR is normal. The urinalysis shows no significant pyuria or bacteria, bland casts. The complete blood count shows WBC 11.5, no anemia or thrombocytopenia.   Radiological Exams on Admission: Dg Thoracic Spine W/swimmers 02/15/2015   IMPRESSION: No acute fracture or subluxation. Electronically Signed   By: Elgie Collard M.D.   On: 02/15/2015 20:54   Ct Head Wo Contrast 02/15/2015  IMPRESSION: No acute intracranial hemorrhage. Age-related atrophy and chronic microvascular ischemic disease. If symptoms persist and there are no contraindications, MRI may provide better evaluation if clinically indicated. Electronically Signed   By: Elgie Collard M.D.   On: 02/15/2015 20:41   Dg Hip Infant Unilat With Pelvis 2-3 Views Left 02/15/2015   IMPRESSION: 1. No acute fracture or dislocation. 2. Left total hip arthroplasty in place.  No hardware complication. Electronically Signed   By: Rise Mu M.D.   On: 02/15/2015 20:57        Assessment/Plan 1. Altered mental status:  Suspect from #2 below and dehydration.  UTI may be contributing, but UA unimpressive, patient with urinary symptoms.  Will hold antibiotics unless culture positive or WBC rises. -Fluids -Hold bupropion -Follow urine culture -Repeat CBC   2. AKI:  This is new.  This is likely related to some hypotension/hypovolemia in context of ACEi and furosemide use, also possibly NSAIDs, which patient has prescribed.  Only scant WBC on UA, otherwise bland. -On furosemide, check FeUrea -Fluid challenge -Trend BMP -Hold ACEi and furosemide  3. HTN:  Relative hypotension at admission. Hold HCTZ-benazepril and furosemide  4. Hypothyroidism:  Continue levothyroxine Check TSH  5. Elevated CK:  Mild, and do not think this is driving AKI.  Repeat CK  6. Medication  reconciliation: Patient somewhat vague at this time of what medicines she takes, and son produces picture on phone of her home medicines, but lacks bupropion, levothyroxine, fentanyl, which patient clearly takes. -Call to patient's pharmacy to confirm medication reconciliation.  7. New murmur: No previous echo.  Patient unaware of murmur. -Echocardiogram as outpatient    DVT PPx: Lovenox Diet:  Regular Consultants: PT/OT Code Status: FULL Family Communication: Son, present at bedside.  Differential, overnight treatment plan and follow up discussed.  CODE STATUS confirmed.  Medical decision making: What exists of the patient's previous chart was reviewed in depth and the case was discussed with Dr. Anitra Lauth. Patient seen 12:22 AM on 02/16/2015.  Disposition Plan:  I recommend admission to med surg.  Clinical condition: stable.  Anticipate fluids and trend BMP and CK.  If mental status improves with resolution of AKI, anticipate home or SNF pending PT eval.      Alberteen Sam [facility] Pager [phone]

## 2015-02-17 LAB — BASIC METABOLIC PANEL
ANION GAP: 4 — AB (ref 5–15)
BUN: 22 mg/dL — AB (ref 6–20)
CO2: 30 mmol/L (ref 22–32)
Calcium: 8.5 mg/dL — ABNORMAL LOW (ref 8.9–10.3)
Chloride: 102 mmol/L (ref 101–111)
Creatinine, Ser: 0.58 mg/dL (ref 0.44–1.00)
GFR calc Af Amer: >60 mL/min (ref >60)
Glucose, Bld: 108 mg/dL — ABNORMAL HIGH (ref 65–99)
POTASSIUM: 4.5 mmol/L (ref 3.5–5.1)
SODIUM: 136 mmol/L (ref 135–145)

## 2015-02-17 LAB — URINE CULTURE: Special Requests: NORMAL

## 2015-02-17 MED ORDER — LEVOTHYROXINE SODIUM 100 MCG PO TABS
100.0000 ug | ORAL_TABLET | Freq: Every day | ORAL | Status: AC
Start: 2015-02-17 — End: ?

## 2015-02-17 MED ORDER — HYDROCODONE-ACETAMINOPHEN 7.5-325 MG PO TABS
1.0000 | ORAL_TABLET | Freq: Four times a day (QID) | ORAL | Status: DC | PRN
  Administered 2015-02-17: 1 via ORAL
  Filled 2015-02-17: qty 1

## 2015-02-17 MED ORDER — DIAZEPAM 2 MG PO TABS: 2.0000 mg | ORAL_TABLET | Freq: Three times a day (TID) | ORAL | Status: AC | PRN

## 2015-02-17 MED ORDER — SENNOSIDES-DOCUSATE SODIUM 8.6-50 MG PO TABS: 1.0000 | ORAL_TABLET | Freq: Every evening | ORAL | Status: DC | PRN

## 2015-02-17 MED ORDER — HYDROCODONE-ACETAMINOPHEN 7.5-325 MG PO TABS: 1.0000 | ORAL_TABLET | Freq: Four times a day (QID) | ORAL | Status: AC | PRN

## 2015-02-17 MED ORDER — FENTANYL 50 MCG/HR TD PT72: 50.0000 ug | MEDICATED_PATCH | TRANSDERMAL | Status: AC

## 2015-02-17 MED ORDER — CIPROFLOXACIN HCL 500 MG PO TABS: 500.0000 mg | ORAL_TABLET | Freq: Two times a day (BID) | ORAL | Status: AC

## 2015-02-17 NOTE — Clinical Social Work Note (Signed)
Clinical Social Work Assessment  Patient Details  Name: Meghan Sexton MRN: [id number] Date of Birth: 13-Sep-1923  Date of referral:  02/17/15               Reason for consult:  Discharge Planning                Permission sought to share information with:  Family Supports Permission granted to share information::  Yes, Verbal Permission Granted  Name::     Meghan Sexton  Agency::     Relationship::  son  Contact Information:  [phone]  Housing/Transportation Living arrangements for the past 2 months:  Single Family Home Source of Information:  Patient Patient Interpreter Needed:  None Criminal Activity/Legal Involvement Pertinent to Current Situation/Hospitalization:  No - Comment as needed Significant Relationships:  Adult Children Lives with:  Self Do you feel safe going back to the place where you live?  No Need for family participation in patient care:  Yes (Comment) (per pt request)  Care giving concerns:  Pt admitted from home alone. PT recommending ST rehab at SNF.    Social Worker assessment / plan:  CSW received referral for New SNF.   CSW met with pt at bedside. CSW introduced self and explained role. Pt reports that she lives at home alone. CSW discussed recommendation for short term rehab at [facility]. Pt agreeable. Pt reports that she has been in rehab in the past. Pt wants [facility] and prefers private room, but pt is agreeable to [facility]. SNF search.   CSW completed FL2 and initiated SNF search to [facility]. CSW contacted [facility] and facility reviewed information and confirmed that facility has a private room available today.   CSW followed up with pt at bedside to discuss bed offers and pt agreeable to [facility] at d/c today. CSW contacted pt son to discuss per pt request.   CSW notified MD.  CSW to facilitate pt discharge to [facility] this afternoon.  Employment status:  Retired Nurse, adult PT  Recommendations:  [facility] / Referral to community resources:  [facility]  Patient/Family's Response to care:  Pt alert and oriented x 4. Pt pleased that [facility] can accept pt today. Pt son plans to see pt at [facility] this evening. Pt appreciative of assistance.   Patient/Family's Understanding of and Emotional Response to Diagnosis, Current Treatment, and Prognosis:  Pt displayed understanding about diagnosis and treatment plan. Pt agreeable to plan for [facility] upon discharge.  Emotional Assessment Appearance:  Appears stated age Attitude/Demeanor/Rapport:  Other (pt appropriate) Affect (typically observed):  Appropriate, Pleasant Orientation:  Oriented to Self, Oriented to Place, Oriented to  Time, Oriented to Situation Alcohol / Substance use:  Not Applicable Psych involvement (Current and /or in the community):  No (Comment)  Discharge Needs  Concerns to be addressed:  Discharge Planning Concerns Readmission within the last 30 days:  No Current discharge risk:  None Barriers to Discharge:  No Barriers Identified   [place], [place], LCSW 02/17/2015, 11:47 AM  [phone]

## 2015-02-17 NOTE — Clinical Social Work Placement (Signed)
   CLINICAL SOCIAL WORK PLACEMENT  NOTE  Date:  02/17/2015  Patient Details  Name: Meghan Sexton MRN: [id number] Date of Birth: November 26, 1923  Clinical Social Work is seeking post-discharge placement for this patient at the Skilled  Nursing Facility level of care (*CSW will initial, date and re-position this form in  chart as items are completed):  Yes   Patient/family provided with [facility] Clinical Social Work Department's list of facilities offering this level of care within the geographic area requested by the patient (or if unable, by the patient's family).  Yes   Patient/family informed of their freedom to choose among providers that offer the needed level of care, that participate in Medicare, Medicaid or managed care program needed by the patient, have an available bed and are willing to accept the patient.  Yes   Patient/family informed of [facility]'s ownership interest in [facility] and [facility], as well as of the fact that they are under no obligation to receive care at these facilities.  PASRR submitted to EDS on       PASRR number received on       Existing PASRR number confirmed on 02/17/15     FL2 transmitted to all facilities in geographic area requested by pt/family on 02/17/15     FL2 transmitted to all facilities within larger geographic area on       Patient informed that his/her managed care company has contracts with or will negotiate with certain facilities, including the following:            Patient/family informed of bed offers received.  Patient chooses bed at       Physician recommends and patient chooses bed at      Patient to be transferred to   on  .  Patient to be transferred to facility by       Patient family notified on   of transfer.  Name of family member notified:        PHYSICIAN Please sign FL2     Additional Comment:    _______________________________________________ Orson Eva, LCSW 02/17/2015, 9:34 AM

## 2015-02-17 NOTE — [place] FL2 (Signed)
[place] MEDICAID FL2 LEVEL OF CARE SCREENING TOOL     IDENTIFICATION  Patient Name: Meghan Sexton Birthdate: April 01, 1923 Sex: female Admission Date (Current Location): 02/15/2015  [facility] and [place] Number:  Producer, television/film/video and Address:  [facility],  [address], [place] [address]      Provider Number: [id number]  Attending Physician Name and Address:  Starleen Arms, MD  Relative Name and Phone Number:       Current Level of Care: Hospital Recommended Level of Care: Skilled Nursing Facility Prior Approval Number:    Date Approved/Denied:   PASRR Number: [id number]  Discharge Plan: SNF    Current Diagnoses: Patient Active Problem List   Diagnosis Date Noted  . Altered mental status 02/16/2015  . Altered mental state 02/16/2015  . Acute kidney injury (HCC) 02/15/2015  . Hyponatremia 12/24/2012  . CAP (community acquired pneumonia) 12/23/2012    Class: Acute  . Anemia 12/23/2012    Class: Chronic  . Hyperkalemia 12/23/2012    Class: Acute  . Influenza 12/20/2012  . Leukocytosis, unspecified 12/20/2012    Class: Acute  . Asthma 12/04/2012  . Insomnia 12/04/2012  . Hip arthritis 12/03/2012  . Acute posthemorrhagic anemia 12/03/2012  . Hypothyroidism 12/03/2012  . S/P left THA, AA 10/23/2012  . S/P Left TKA 04/04/2011  . Essential hypertension 03/12/2008  . COPD without exacerbation (HCC) 03/12/2008  . CONSTIPATION 03/12/2008  . OTHER DYSPHAGIA 03/12/2008  . ESOPHAGEAL STRICTURE 03/23/2007  . DYSPHAGIA UNSPECIFIED 03/23/2007  . EPIGASTRIC PAIN 03/23/2007  . HEMORRHOIDS, EXTERNAL 11/01/2006  . GERD 11/01/2006  . HIATAL HERNIA 11/01/2006  . DIVERTICULOSIS, COLON 11/01/2006    Orientation RESPIRATION BLADDER Height & Weight     Self, Time, Situation, Place  Normal Continent Weight: 123 lb 4.8 oz (55.929 kg) Height:   (142.2 cm)  BEHAVIORAL SYMPTOMS/MOOD NEUROLOGICAL BOWEL NUTRITION STATUS   (n/a)  (NONE) Continent  Diet (Diet Regular)  AMBULATORY STATUS COMMUNICATION OF NEEDS Skin   Limited Assist Verbally Normal                       Personal Care Assistance Level of Assistance  Bathing, Feeding, Dressing Bathing Assistance: Limited assistance Feeding assistance: Independent Dressing Assistance: Limited assistance     Functional Limitations Info  Sight, Hearing, Speech Sight Info: Adequate Hearing Info: Adequate Speech Info: Adequate    SPECIAL CARE FACTORS FREQUENCY  PT (By licensed PT), OT (By licensed OT)     PT Frequency: 5 x a week OT Frequency: 5 x a week            Contractures Contractures Info: Not present    Additional Factors Info  Code Status, Allergies Code Status Info: FULL code status Allergies Info: Codeine, Lactose, Penicillins, Sulfonamide Derivatives           Current Medications (02/17/2015):  This is the current hospital active medication list Current Facility-Administered Medications  Medication Dose Route Frequency Provider Last Rate Last Dose  . 0.9 %  sodium chloride infusion   Intravenous Continuous Alberteen Sam, MD 100 mL/hr at 02/16/15 1133 1,000 mL at 02/16/15 1133  . acetaminophen (TYLENOL) tablet 650 mg  650 mg Oral Q6H PRN Alberteen Sam, MD   650 mg at 02/17/15 0144   Or  . acetaminophen (TYLENOL) suppository 650 mg  650 mg Rectal Q6H PRN Alberteen Sam, MD      . ciprofloxacin (CIPRO) IVPB 400 mg  400 mg  Intravenous Q24H Starleen Arms, MD   400 mg at 02/16/15 1134  . enoxaparin (LOVENOX) injection 30 mg  30 mg Subcutaneous Daily Alberteen Sam, MD   30 mg at 02/16/15 1904  . feeding supplement (ENSURE ENLIVE) (ENSURE ENLIVE) liquid 237 mL  237 mL Oral BID BM Tilda Franco, RD   237 mL at 02/16/15 1850  . fentaNYL (DURAGESIC - dosed mcg/hr) 50 mcg  50 mcg Transdermal Q72H Alberteen Sam, MD   50 mcg at 02/16/15 0141  . gabapentin (NEURONTIN) capsule 100 mg  100 mg Oral BID Alberteen Sam, MD    100 mg at 02/16/15 2039  . HYDROcodone-acetaminophen (NORCO) 7.5-325 MG per tablet 1 tablet  1 tablet Oral Q6H PRN Leanne Chang, NP   1 tablet at 02/17/15 0255  . levothyroxine (SYNTHROID, LEVOTHROID) tablet 125 mcg  125 mcg Oral QAC breakfast Alberteen Sam, MD   125 mcg at 02/16/15 [phone]  . pantoprazole (PROTONIX) EC tablet 40 mg  40 mg Oral Daily Alberteen Sam, MD   40 mg at 02/16/15 1134  . senna-docusate (Senokot-S) tablet 1 tablet  1 tablet Oral QHS PRN Alberteen Sam, MD         Discharge Medications: Please see discharge summary for a list of discharge medications.  Relevant Imaging Results:  Relevant Lab Results:   Additional Information SSN: [id number]  KIDD, SUZANNA A, LCSW

## 2015-02-17 NOTE — Clinical Social Work Placement (Signed)
   CLINICAL SOCIAL WORK PLACEMENT  NOTE  Date:  02/17/2015  Patient Details  Name: [name] MRN: [id number] Date of Birth: 04-Aug-1923  Clinical Social Work is seeking post-discharge placement for this patient at the Skilled  Nursing Facility level of care (*CSW will initial, date and re-position this form in  chart as items are completed):  Yes   Patient/family provided with [place] Clinical Social Work Department's list of facilities offering this level of care within the geographic area requested by the patient (or if unable, by the patient's family).  Yes   Patient/family informed of their freedom to choose among providers that offer the needed level of care, that participate in Medicare, Medicaid or managed care program needed by the patient, have an available bed and are willing to accept the patient.  Yes   Patient/family informed of [facility]'s ownership interest in [facility] and [facility], as well as of the fact that they are under no obligation to receive care at these facilities.  PASRR submitted to EDS on       PASRR number received on       Existing PASRR number confirmed on 02/17/15     FL2 transmitted to all facilities in geographic area requested by pt/family on 02/17/15     FL2 transmitted to all facilities within larger geographic area on       Patient informed that his/her managed care company has contracts with or will negotiate with certain facilities, including the following:        Yes   Patient/family informed of bed offers received.  Patient chooses bed at [facility]     Physician recommends and patient chooses bed at      Patient to be transferred to [facility] on  .  Patient to be transferred to facility by       Patient family notified on   of transfer.  Name of family member notified:        PHYSICIAN Please sign FL2     Additional Comment:    _______________________________________________ Orson Eva,  LCSW 02/17/2015, 11:54 AM

## 2015-02-17 NOTE — Discharge Instructions (Signed)
Follow with Primary MD Gwen Pounds, MD after discharge from SNF  Get CBC, CMP, checked  by Primary MD next visit.    Activity: As tolerated with Full fall precautions use walker/cane & assistance as needed   Disposition Home    Diet: Heart Healthy , with feeding assistance and aspiration precautions.  For Heart failure patients - Check your Weight same time everyday, if you gain over 2 pounds, or you develop in leg swelling, experience more shortness of breath or chest pain, call your Primary MD immediately. Follow Cardiac Low Salt Diet and 1.5 lit/day fluid restriction.   On your next visit with your primary care physician please Get Medicines reviewed and adjusted.   Please request your Prim.MD to go over all Hospital Tests and Procedure/Radiological results at the follow up, please get all Hospital records sent to your Prim MD by signing hospital release before you go home.   If you experience worsening of your admission symptoms, develop shortness of breath, life threatening emergency, suicidal or homicidal thoughts you must seek medical attention immediately by calling 911 or calling your MD immediately  if symptoms less severe.  You Must read complete instructions/literature along with all the possible adverse reactions/side effects for all the Medicines you take and that have been prescribed to you. Take any new Medicines after you have completely understood and accpet all the possible adverse reactions/side effects.   Do not drive, operating heavy machinery, perform activities at heights, swimming or participation in water activities or provide baby sitting services if your were admitted for syncope or siezures until you have seen by Primary MD or a Neurologist and advised to do so again.  Do not drive when taking Pain medications.    Do not take more than prescribed Pain, Sleep and Anxiety Medications  Special Instructions: If you have smoked or chewed Tobacco  in the last  2 yrs please stop smoking, stop any regular Alcohol  and or any Recreational drug use.  Wear Seat belts while driving.   Please note  You were cared for by a hospitalist during your hospital stay. If you have any questions about your discharge medications or the care you received while you were in the hospital after you are discharged, you can call the unit and asked to speak with the hospitalist on call if the hospitalist that took care of you is not available. Once you are discharged, your primary care physician will handle any further medical issues. Please note that NO REFILLS for any discharge medications will be authorized once you are discharged, as it is imperative that you return to your primary care physician (or establish a relationship with a primary care physician if you do not have one) for your aftercare needs so that they can reassess your need for medications and monitor your lab values.

## 2015-02-17 NOTE — Discharge Summary (Signed)
Meghan Sexton, is a 82 y.o. female  DOB February 17, 1923  MRN [id number].  Admission date:  02/15/2015  Admitting Physician  Alberteen Sam, MD  Discharge Date:  02/17/2015   Primary MD  Gwen Pounds, MD  Recommendations for primary care physician for things to follow:  - Please check CBC, BMP in 3 days - Please check TSH in 4-6 weeks - Please take 2-D echo as an outpatient  Admission Diagnosis  Dehydration [E86.0] Fall [W19.XXXA] Acute kidney injury (HCC) [N17.9] Altered mental status, unspecified altered mental status type [R41.82]   Discharge Diagnosis  Dehydration [E86.0] Fall [W19.XXXA] Acute kidney injury (HCC) [N17.9] Altered mental status, unspecified altered mental status type [R41.82]    Principal Problem:   Acute kidney injury [facility]) Active Problems:   Essential hypertension   Hypothyroidism   Anemia   Altered mental status   Altered mental state      Past Medical History  Diagnosis Date  . Hypertension   . Asthma   . Hypothyroidism   . Constipation   . Blood transfusion 2010     1 unit after each back surgery  . Sleep apnea     stopbang=4  . Anxiety   . Depression   . GERD (gastroesophageal reflux disease)   . Arthritis     Past Surgical History  Procedure Laterality Date  . Whole back surgery for scioliosis   rods inserted ,with last surgery 2010    surgery done x 3 times--05/27/2005,05/2009,10/19/2009  . Right rotator cuff repair  09/07/1993  . Malignant melanoa removed from right shoulder   60yrs ago  . Melignant melanoma removed from near left shin  6 yrs ago  . Bunion removed from left foot  50 yrs ago  . Lens implants both eyes  15 yrs ago  . Total knee arthroplasty  04/04/2011    Procedure: TOTAL KNEE ARTHROPLASTY;  Surgeon: [name], MD;  Location: [facility]S;  Service: Orthopedics;  Laterality: Left;  . Tonsillectomy  1936  . Appendectomy  1956   with suspension of uterus  . Tubal ligation  yrs ago    59  . Hernia repair  yrs ago    1958  . Cholecystectomy  yrs ago    60  . Eye surgery  12/1995,01/1996    bilateral cararacts with lens implants  . Hiatal hernia repair  01/09/2004    large paraesophageal  . Abdominal hysterectomy  1964- age 43    partial  . Back surgery  05/27/2005    for lumbar scoliotic deformity  . Breast surgery  2010    right breast-benign  . Anterior /posterior rectocele  07/12/1991    and ovarian cystectomy  . Total hip arthroplasty Left 10/23/2012    Procedure: LEFT TOTAL HIP ARTHROPLASTY ANTERIOR APPROACH;  Surgeon: [name], MD;  Location: [facility];  Service: Orthopedics;  Laterality: Left;       History of present illness and  Hospital Course:     Kindly see H&P for history  of present illness and admission details, please review complete Labs, Consult reports and Test reports for all details in brief  HPI  from the history and physical done on the day of admission 02/16/2015  Meghan Sexton is a 82 y.o. female with a past medical history significant for HTN, COPD who presents with fall and confusion.  The patient is accompanied by her son who corroborates the history. They report that she lives independently, but Meghan Sexton son has been noticing more "strange behavior" and seeing or hearing things that aren't there over the last several days, he thinks since starting bupropion for depression. Saturday, she wouldn't answer her phone so he went over to her house in the evening and found her on the floor in her bedroom. She reports that she had fallen and was laying there an unknown length of time (possibly >6-8 hrs). Today, she continued to see and hear things that weren't there complained of aches in her back, and so her family brought her to the ER.  In the ED, she was mildly hypotensive, K3.6, HCO3 31, Cr 1.71 (up from a baseline of 0.532 years ago), WBC 11.5, hemoglobin 12, CK 294, INR  normal. The urinalysis showed casts, and no significant pyuria or bacteriuria. A CT head, and radiographs of the back and hips were unremarkable. Fluids were administered and TRH were asked to evaluate for admission.  Hospital Course   Altered mental status/metabolic encephalopathy - due to dehydration and UTI, CT head with no acute finding, resolved, back to baseline  AKI - This is likely related to some hypotension/hypovolemia in context of ACEi and furosemide use, also possibly NSAIDs, which patient has prescribed. - Continue to hold ACEI/hydrochlorothiazide and Furosemide on discharge - Resolved with hydration, creatinine 0.58 on discharge  UTI  - Treated with IV ciprofloxacin, to finish another 2 days of by mouth Cipro as an outpatient.  HTN - Soft blood pressure during hospital stay - Continue to Hold HCTZ-benazepril and furosemide on discharge  Hypothyroidism  - Continue levothyroxine, TSH on the lower side, 0.29, decreased her levothyroxine from 125 g to 100 g, is recheck TSH in 4-6 weeks  Elevated CK/mild rhabdomyolysis  - She reports she was on the floor for few hours , treated with gentle hydration, resolved, total CK within normal limits   New murmur No previous echo. Patient unaware of murmur. - Echocardiogram as outpatient     Discharge Condition:  Stable   Follow UP  Follow-up Information    Follow up with [facility].   Specialty:  Skilled Nursing Facility   Contact information:   [address] [place] [address] [phone]      Follow up with Gwen Pounds, MD.   Specialty:  Internal Medicine   Why:  After discharge from SNF   Contact information:   [address] [place] [address] [phone]         Discharge Instructions  and  Discharge Medications       Discharge Instructions    Discharge instructions    Complete by:  As directed   Follow with Primary MD Gwen Pounds, MD after discharge  from SNF  Get CBC, CMP, 2 view Chest X ray checked  by Primary MD next visit.    Activity: As tolerated with Full fall precautions use walker/cane & assistance as needed   Disposition Home **   Diet: Heart Healthy ** , with feeding assistance and aspiration precautions.  For Heart failure patients -  Check your Weight same time everyday, if you gain over 2 pounds, or you develop in leg swelling, experience more shortness of breath or chest pain, call your Primary MD immediately. Follow Cardiac Low Salt Diet and 1.5 lit/day fluid restriction.   On your next visit with your primary care physician please Get Medicines reviewed and adjusted.   Please request your Prim.MD to go over all Hospital Tests and Procedure/Radiological results at the follow up, please get all Hospital records sent to your Prim MD by signing hospital release before you go home.   If you experience worsening of your admission symptoms, develop shortness of breath, life threatening emergency, suicidal or homicidal thoughts you must seek medical attention immediately by calling 911 or calling your MD immediately  if symptoms less severe.  You Must read complete instructions/literature along with all the possible adverse reactions/side effects for all the Medicines you take and that have been prescribed to you. Take any new Medicines after you have completely understood and accpet all the possible adverse reactions/side effects.   Do not drive, operating heavy machinery, perform activities at heights, swimming or participation in water activities or provide baby sitting services if your were admitted for syncope or siezures until you have seen by Primary MD or a Neurologist and advised to do so again.  Do not drive when taking Pain medications.    Do not take more than prescribed Pain, Sleep and Anxiety Medications  Special Instructions: If you have smoked or chewed Tobacco  in the last 2 yrs please stop smoking, stop  any regular Alcohol  and or any Recreational drug use.  Wear Seat belts while driving.   Please note  You were cared for by a hospitalist during your hospital stay. If you have any questions about your discharge medications or the care you received while you were in the hospital after you are discharged, you can call the unit and asked to speak with the hospitalist on call if the hospitalist that took care of you is not available. Once you are discharged, your primary care physician will handle any further medical issues. Please note that NO REFILLS for any discharge medications will be authorized once you are discharged, as it is imperative that you return to your primary care physician (or establish a relationship with a primary care physician if you do not have one) for your aftercare needs so that they can reassess your need for medications and monitor your lab values.     Increase activity slowly    Complete by:  As directed             Medication List    STOP taking these medications        benazepril-hydrochlorthiazide 20-12.5 MG tablet  Commonly known as:  LOTENSIN HCT     furosemide 40 MG tablet  Commonly known as:  LASIX      TAKE these medications        buPROPion 150 MG 24 hr tablet  Commonly known as:  WELLBUTRIN XL  Take 150 mg by mouth daily.     ciprofloxacin 500 MG tablet  Commonly known as:  CIPRO  Take 1 tablet (500 mg total) by mouth 2 (two) times daily. Please take for 2 days then stop     cycloSPORINE 0.05 % ophthalmic emulsion  Commonly known as:  RESTASIS  Place 1 drop into both eyes every 12 (twelve) hours. Patient has not had in a few months but says she needs  some now.     diazepam 2 MG tablet  Commonly known as:  VALIUM  Take 1 tablet (2 mg total) by mouth every 8 (eight) hours as needed for anxiety. For anxiety     feeding supplement (ENSURE COMPLETE) Liqd  Take 237 mLs by mouth daily.     fentaNYL 50 MCG/HR  Commonly known as:  DURAGESIC -  dosed mcg/hr  Place 1 patch (50 mcg total) onto the skin every 3 (three) days. DUE FOR ONE NOW     gabapentin 100 MG capsule  Commonly known as:  NEURONTIN  Take 100 mg by mouth 2 (two) times daily.     HYDROcodone-acetaminophen 7.5-325 MG tablet  Commonly known as:  NORCO  Take 1 tablet by mouth every 6 (six) hours as needed for moderate pain.     levothyroxine 100 MCG tablet  Commonly known as:  SYNTHROID  Take 1 tablet (100 mcg total) by mouth daily before breakfast.     pantoprazole 40 MG tablet  Commonly known as:  PROTONIX  Take 40 mg by mouth daily.     senna-docusate 8.6-50 MG tablet  Commonly known as:  Senokot-S  Take 1 tablet by mouth at bedtime as needed for mild constipation.          Diet and Activity recommendation: See Discharge Instructions above   Consults obtained -  None   Major procedures and Radiology Reports - PLEASE review detailed and final reports for all details, in brief -      Dg Thoracic Spine 2 View  02/16/2015  CLINICAL DATA:  Back pain after falling out of bed several days ago. EXAM: THORACIC SPINE 2 VIEWS COMPARISON:  February 15, 2015. FINDINGS: Status post extensive surgical posterior fusion extending from mid thoracic spine into lumbar spine which is unchanged compared to prior exam. Stable moderate compression deformity of T5 vertebral body. No acute fracture or spondylolisthesis is noted. IMPRESSION: Extensive postsurgical changes as described above. Multi 5 compression deformity is noted. No acute abnormality seen. Electronically Signed   By: Lupita Raider, M.D.   On: 02/16/2015 13:46   Dg Thoracic Spine W/swimmers  02/15/2015  CLINICAL DATA:  82 year old female with back pain after falling out of bed. History of prior back surgery. EXAM: THORACIC SPINE - 3 VIEWS COMPARISON:  Radiograph dated 12/23/2012 FINDINGS: No acute fracture or subluxation. The bones are osteopenic. There is mild compression deformity and anterior wedging of an  upper thoracic vertebra similar to prior study. Thoracolumbar fixation hardware noted. IMPRESSION: No acute fracture or subluxation. Electronically Signed   By: Elgie Collard M.D.   On: 02/15/2015 20:54   Dg Lumbar Spine 2-3 Views  02/16/2015  CLINICAL DATA:  Left-sided low back pain after falling out of bed several days ago. EXAM: LUMBAR SPINE - 2-3 VIEW COMPARISON:  November 08, 2004. FINDINGS: Status post surgical posterior fusion extending from thoracic spine and through lumbar spine into S1 and S2. Good alignment of vertebral bodies is noted. No acute fracture or spondylolisthesis is noted. Degenerative disc disease is noted at multiple levels. IMPRESSION: Extensive postsurgical changes as described above. No acute abnormality seen. Electronically Signed   By: Lupita Raider, M.D.   On: 02/16/2015 13:48   Dg Pelvis 1-2 Views  02/16/2015  CLINICAL DATA:  Left hip pain after falling out of bed several days ago. EXAM: PELVIS - 1-2 VIEW COMPARISON:  October 23, 2012. FINDINGS: There is no evidence of pelvic fracture or diastasis. No pelvic bone  lesions are seen. Status post left hip arthroplasty. Surgical posterior fusion of lumbar spine and upper sacrum is noted. IMPRESSION: No acute abnormality seen in the pelvis. Extensive postsurgical changes as described above. Electronically Signed   By: Lupita Raider, M.D.   On: 02/16/2015 13:49   Ct Head Wo Contrast  02/15/2015  CLINICAL DATA:  82 year old female with increased altered mental status and visual hallucinations. EXAM: CT HEAD WITHOUT CONTRAST TECHNIQUE: Contiguous axial images were obtained from the base of the skull through the vertex without intravenous contrast. COMPARISON:  None. FINDINGS: The ventricles are dilated and the sulci are prominent compatible with age-related atrophy. Moderate Periventricular and deep white matter hypodensities represent chronic microvascular ischemic changes. There is no intracranial hemorrhage. No mass effect or  midline shift identified. There is mild mucoperiosteal thickening of the right maxillary sinus. The remainder of the paranasal sinuses for. Dilated. Multiple opacified mastoid air cells are seen bilaterally. The remainder of the visualized mastoid air cells are clear. The calvarium is intact. IMPRESSION: No acute intracranial hemorrhage. Age-related atrophy and chronic microvascular ischemic disease. If symptoms persist and there are no contraindications, MRI may provide better evaluation if clinically indicated. Electronically Signed   By: Elgie Collard M.D.   On: 02/15/2015 20:41   Dg Hip Infant Unilat With Pelvis 2-3 Views Left  02/15/2015  CLINICAL DATA:  Initial evaluation for acute left hip pain status post fall. EXAM: DG HIP (WITH OR WITHOUT PELVIS) INFANT 2-3V LEFT COMPARISON:  None. FINDINGS: Left hip arthroplasty in place. The acetabular and femoral components articulate normally with 1 another. No periprosthetic lucency to suggest loosening or infection. No fracture or dislocation. Limited views of the pelvis demonstrate no acute abnormality. Fixation hardware present within the lumbosacral spine. No acute soft tissue abnormality. IMPRESSION: 1. No acute fracture or dislocation. 2. Left total hip arthroplasty in place.  No hardware complication. Electronically Signed   By: Rise Mu M.D.   On: 02/15/2015 20:57    Micro Results     No results found for this or any previous visit (from the past 240 hour(s)).     Today   Subjective:   Bianey Tesoro today has no headache,no chest abdominal pain,no new weakness tingling or numbness, feels much better wants to go home today.  Objective:   Blood pressure 94/40, pulse 78, temperature 98.6 F (37 C), temperature source Oral, resp. rate 16, height 4\' 8"  (1.422 m), weight 55.929 kg (123 lb 4.8 oz), SpO2 99 %.   Intake/Output Summary (Last 24 hours) at 02/17/15 1217 Last data filed at 02/17/15 1610  Gross per 24 hour    Intake   1624 ml  Output      0 ml  Net   1624 ml    Exam Awake Alert, Oriented X 3,  [place].AT,PERRAL Supple Neck,No JVD, Symmetrical Chest wall movement, Good air movement bilaterally, CTAB RRR,No Gallops,Rubs , No Parasternal Heave, SEM + +ve B.Sounds, Abd Soft, No tenderness, No rebound - guarding or rigidity. No Cyanosis, Clubbing or edema, No new Rash or bruise   Data Review   CBC w Diff:  Lab Results  Component Value Date   WBC 6.8 02/16/2015   HGB 10.2* 02/16/2015   HCT 30.9* 02/16/2015   PLT 209 02/16/2015   LYMPHOPCT 3* 12/19/2012   MONOPCT 6 12/19/2012   EOSPCT 0 12/19/2012   BASOPCT 0 12/19/2012    CMP:  Lab Results  Component Value Date   NA 136 02/17/2015   K  4.5 02/17/2015   CL 102 02/17/2015   CO2 30 02/17/2015   BUN 22* 02/17/2015   CREATININE 0.58 02/17/2015   PROT 4.8* 02/16/2015   ALBUMIN 2.9* 02/16/2015   BILITOT 0.2* 02/16/2015   ALKPHOS 43 02/16/2015   AST 23 02/16/2015   ALT 13* 02/16/2015  .   Total Time in preparing paper work, data evaluation and todays exam - 35 minutes  Cathlyn Tersigni M.D on 02/17/2015 at 12:17 PM  [facility]   Office  [phone]

## 2015-02-17 NOTE — Clinical Documentation Improvement (Signed)
Internal Medicine  Can the diagnosis of altered mental status be further specified?   Encephalopathy - Alcoholic, Anoxic/Hypoxia, Drug Induced/Toxic (specify drug), Hepatic, Hypertensive, Hypoglycemic, Metabolic/Septic, Traumatic/post concussive, Wernicke, Other  Other  Clinically Undetermined  Document any associated diagnoses/conditions.   Supporting Information: "Altered mental status: " "Suspect from #2 (AKI) below and dehydration." "son has been noticing more "strange behavior" and seeing or hearing things that aren't there over the last several days"  Please update your documentation within the medical record to reflect your response to this query. Thank you.  Please exercise your independent, professional judgment when responding. A specific answer is not anticipated or expected.  Thank You, Nevin Bloodgood, RN, BSN, CCDS,Clinical Documentation Specialist:  [phone]  [phone]=[facility] [place]- Health Information Management

## 2015-02-17 NOTE — Progress Notes (Signed)
Occupational Therapy Evaluation Patient Details Name: Meghan Sexton MRN: [id number] DOB: 12/15/1923 Today's Date: 02/17/2015    History of Present Illness Meghan Sexton is a 82 y.o. female with a past medical history significant for HTN, COPD who presents with fall and confusion.PMH: L THA, L TKA, multiple back surgeries;   Clinical Impression   Patient presents to OT with decreased ADL independence and safety due to the deficits listed below. She will benefit from skilled OT to maximize function and facilitate a safe discharge. OT will follow.    Follow Up Recommendations  SNF    Equipment Recommendations  Other (comment) (tbd at next venue of care)    Recommendations for Other Services       Precautions / Restrictions Precautions Precautions: Fall Precaution Comments: pt reports this is her first fall that she can recall Restrictions Weight Bearing Restrictions: No      Mobility Bed Mobility Overal bed mobility: Needs Assistance Bed Mobility: Supine to Sit;Sit to Supine     Supine to sit: Supervision Sit to supine: Supervision   General bed mobility comments: increased time and use of bedrails  Transfers Overall transfer level: Needs assistance Equipment used: Rolling walker (2 wheeled) Transfers: Sit to/from Stand Sit to Stand: Min guard         General transfer comment: assist to stabilize in standing    Balance                                            ADL Overall ADL's : Needs assistance/impaired Eating/Feeding: Independent;Bed level   Grooming: Wash/dry hands;Min guard;Standing   Upper Body Bathing: Minimal assitance;Sitting   Lower Body Bathing: Moderate assistance;Sit to/from stand   Upper Body Dressing : Minimal assistance;Sitting   Lower Body Dressing: Moderate assistance;Sit to/from stand   Toilet Transfer: Min guard;Ambulation;BSC;RW   Toileting- Architect and Hygiene: Min guard;Sit to/from  stand       Functional mobility during ADLs: Min guard;Rolling walker General ADL Comments: Patient agreeable to toileting/grooming activities, then wanted to return to bed. Mildly unsteady ambulating to/from bathroom with RW. Tolerated session well.     Vision     Perception     Praxis      Pertinent Vitals/Pain Pain Assessment: 0-10 Pain Score: 7  Pain Location: back Pain Descriptors / Indicators: Discomfort;Sore;Aching Pain Intervention(s): Limited activity within patient's tolerance;Monitored during session;Repositioned     Hand Dominance Right   Extremity/Trunk Assessment Upper Extremity Assessment Upper Extremity Assessment: RUE deficits/detail;Generalized weakness RUE Deficits / Details: limited shoulder AROM -- pt reports rotator cuff problems, AAROM [facility]   Lower Extremity Assessment Lower Extremity Assessment: Defer to PT evaluation   Cervical / Trunk Assessment Cervical / Trunk Assessment: Kyphotic   Communication Communication Communication: HOH   Cognition Arousal/Alertness: Awake/alert Behavior During Therapy: WFL for tasks assessed/performed Overall Cognitive Status: Within Functional Limits for tasks assessed                     General Comments       Exercises       Shoulder Instructions      Home Living Family/patient expects to be discharged to:: Private residence Living Arrangements: Alone   Type of Home: House Home Access: Level entry     Home Layout: One level     Bathroom Shower/Tub: Chief Strategy Officer: Standard  Home Equipment: Walker - 2 wheels;Cane - single point          Prior Functioning/Environment Level of Independence: Independent with assistive device(s)        Comments: amb with RW    OT Diagnosis: Generalized weakness;Acute pain   OT Problem List: Decreased strength;Decreased activity tolerance;Impaired balance (sitting and/or standing);Decreased knowledge of use of DME or  AE;Pain;Impaired UE functional use   OT Treatment/Interventions: Self-care/ADL training;DME and/or AE instruction;Therapeutic activities;Patient/family education    OT Goals(Current goals can be found in the care plan section) Acute Rehab OT Goals Patient Stated Goal: to go to rehab OT Goal Formulation: With patient Time For Goal Achievement: 03/03/15 Potential to Achieve Goals: Good  OT Frequency: Min 2X/week   Barriers to D/C: Decreased caregiver support  lives alone       Co-evaluation              End of Session Equipment Utilized During Treatment: Rolling walker  Activity Tolerance: Patient tolerated treatment well Patient left: in bed;with call bell/phone within reach;with bed alarm set   Time: 1610-9604 OT Time Calculation (min): 19 min Charges:  OT General Charges $OT Visit: 1 Procedure OT Evaluation $OT Eval Moderate Complexity: 1 Procedure G-Codes:    Alverto Shedd A 03-11-15, 10:25 AM

## 2015-02-17 NOTE — Clinical Social Work Placement (Signed)
   CLINICAL SOCIAL WORK PLACEMENT  NOTE  Date:  02/17/2015  Patient Details  Name: Meghan Sexton MRN: [id number] Date of Birth: [birth year]  Clinical Social Work is seeking post-discharge placement for this patient at the Skilled  Nursing Facility level of care (*CSW will initial, date and re-position this form in  chart as items are completed):  Yes   Patient/family provided with [facility] Clinical Social Work Department's list of facilities offering this level of care within the geographic area requested by the patient (or if unable, by the patient's family).  Yes   Patient/family informed of their freedom to choose among providers that offer the needed level of care, that participate in Medicare, Medicaid or managed care program needed by the patient, have an available bed and are willing to accept the patient.  Yes   Patient/family informed of [facility]'s ownership interest in [facility] and [facility], as well as of the fact that they are under no obligation to receive care at these facilities.  PASRR submitted to EDS on       PASRR number received on       Existing PASRR number confirmed on 02/17/15     FL2 transmitted to all facilities in geographic area requested by pt/family on 02/17/15     FL2 transmitted to all facilities within larger geographic area on       Patient informed that his/her managed care company has contracts with or will negotiate with certain facilities, including the following:        Yes   Patient/family informed of bed offers received.  Patient chooses bed at [facility]     Physician recommends and patient chooses bed at      Patient to be transferred to [facility] on 02/17/15.  Patient to be transferred to facility by ambulance Sharin Mons)     Patient family notified on 02/17/15 of transfer.  Name of family member notified:  pt notified at bedside and pt son, Meghan Sexton via telephone     PHYSICIAN Please sign FL2     Additional  Comment:    _______________________________________________ Orson Eva, LCSW 02/17/2015, 2:13 PM

## 2015-02-17 NOTE — Care Management Note (Signed)
Case Management Note  Patient Details  Name: LEROY TRIM MRN: [id number] Date of Birth: 04-24-1923  Subjective/Objective:        82 yo admitted with Acute Kidney Injury            Action/Plan:F From home alone.  To DC to SNF  Expected Discharge Date:                  Expected Discharge Plan:  Skilled Nursing Facility  In-House Referral:  Clinical Social Work  Discharge planning Services  CM Consult  Post Acute Care Choice:    Choice offered to:     DME Arranged:    DME Agency:     HH Arranged:    HH Agency:     Status of Service:  In process, will continue to follow  Medicare Important Message Given:    Date Medicare IM Given:    Medicare IM give by:    Date Additional Medicare IM Given:    Additional Medicare Important Message give by:     If discussed at Long Length of Stay Meetings, dates discussed:    Additional Comments: Chart reviewed and no CM needs identified or communicated at this time. CM will continue to follow. Sandford Craze RN,BSN,NCM [phone] Bartholome Bill, RN 02/17/2015, 11:19 AM

## 2015-02-17 NOTE — Progress Notes (Signed)
Pt for discharge to [facility].   CSW facilitated pt discharge needs including contacting facility, faxing pt discharge information via [employer], discussing with pt at bedside and pt son, Larita Fife via telephone, providing RN phone number to call report, and arranging ambulance transport for pt to [facility].  No further social work needs identified at this time.   CSW signing off.   Loletta Specter, MSW, LCSW Clinical Social Work [phone]

## 2015-02-18 ENCOUNTER — Encounter: Payer: Self-pay | Admitting: Internal Medicine

## 2015-02-18 ENCOUNTER — Non-Acute Institutional Stay (SKILLED_NURSING_FACILITY): Payer: Medicare Other | Admitting: Internal Medicine

## 2015-02-18 DIAGNOSIS — K449 Diaphragmatic hernia without obstruction or gangrene: Secondary | ICD-10-CM

## 2015-02-18 DIAGNOSIS — K5900 Constipation, unspecified: Secondary | ICD-10-CM

## 2015-02-18 DIAGNOSIS — E46 Unspecified protein-calorie malnutrition: Secondary | ICD-10-CM

## 2015-02-18 DIAGNOSIS — E038 Other specified hypothyroidism: Secondary | ICD-10-CM | POA: Diagnosis not present

## 2015-02-18 DIAGNOSIS — K219 Gastro-esophageal reflux disease without esophagitis: Secondary | ICD-10-CM

## 2015-02-18 DIAGNOSIS — D649 Anemia, unspecified: Secondary | ICD-10-CM

## 2015-02-18 DIAGNOSIS — F329 Major depressive disorder, single episode, unspecified: Secondary | ICD-10-CM

## 2015-02-18 DIAGNOSIS — N390 Urinary tract infection, site not specified: Secondary | ICD-10-CM

## 2015-02-18 DIAGNOSIS — I10 Essential (primary) hypertension: Secondary | ICD-10-CM | POA: Diagnosis not present

## 2015-02-18 DIAGNOSIS — R531 Weakness: Secondary | ICD-10-CM | POA: Diagnosis not present

## 2015-02-18 DIAGNOSIS — N179 Acute kidney failure, unspecified: Secondary | ICD-10-CM | POA: Diagnosis not present

## 2015-02-18 DIAGNOSIS — M159 Polyosteoarthritis, unspecified: Secondary | ICD-10-CM

## 2015-02-18 DIAGNOSIS — F32A Depression, unspecified: Secondary | ICD-10-CM

## 2015-02-18 NOTE — Progress Notes (Signed)
Patient ID: Meghan Sexton, female   DOB: 17-Feb-1923, 82 y.o.   MRN: [id number]    LOCATION: [facility]  PCP: Gwen Pounds, MD   Code Status: Full Code  Goals of care: Advanced Directive information Advanced Directives 02/15/2015  Does patient have an advance directive? No  Type of Advance Directive -  Does patient want to make changes to advanced directive? -  Copy of advanced directive(s) in chart? -  Pre-existing out of facility DNR order (yellow form or pink MOST form) -       Extended Emergency Contact Information Primary Emergency Contact: Jennye Boroughs, [place] [address] [place] Home Phone: [phone] Mobile Phone: [phone] Relation: Grandaughter Secondary Emergency Contact: Byard,Charles Address: [address]          [place], [place] [address] [place] Home Phone: [phone] Mobile Phone: [phone] Relation: Son   Allergies  Allergen Reactions  . Codeine Nausea And Vomiting  . Lactose Nausea And Vomiting    GI upset - can tolerate some milk products  . Penicillins Other (See Comments)    Nodules in legs Has patient had a PCN reaction causing immediate rash, facial/tongue/throat swelling, SOB or lightheadedness with hypotension: No Has patient had a PCN reaction causing severe rash involving mucus membranes or skin necrosis: No Has patient had a PCN reaction that required hospitalization No Has patient had a PCN reaction occurring within the last 10 years: No If all of the above answers are "NO", then may proceed with Cephalosporin use.  . Sulfonamide Derivatives Nausea Only    Chief Complaint  Patient presents with  . New Admit To SNF    New Admission     HPI:  Patient is a 82 y.o. female seen today for short term rehabilitation post hospital admission from 02/15/15-02/17/15 with fall and generalized weakness with altered mental status. She was diagnosed with UTI and acute renal failure. She received  antibiotics and iv fluids. Her BP medication were held. Ct scan of brain was negative for acute findings. She has PMH of HTN, COPD, hypothyroidism, constipation among others. She is seen in her room today. She is weak and tired. Denies any concerns.   Review of Systems:  Constitutional: Negative for fever, chills, diaphoresis.  HENT: Negative for headache, congestion, nasal discharge Eyes: Negative for eye pain, blurred vision, double vision and discharge.  Respiratory: Negative for cough, shortness of breath and wheezing.   Cardiovascular: Negative for chest pain, palpitations, leg swelling.  Gastrointestinal: Negative for heartburn, nausea, vomiting, abdominal pain. Had bowel movement 3 days back Genitourinary: Negative for dysuria Musculoskeletal: Negative for back pain, falls in the facility Skin: Negative for itching, rash.  Neurological: Negative for dizziness Psychiatric/Behavioral: Negative for depression   Past Medical History  Diagnosis Date  . Hypertension   . Asthma   . Hypothyroidism   . Constipation   . Blood transfusion 2010     1 unit after each back surgery  . Sleep apnea     stopbang=4  . Anxiety   . Depression   . GERD (gastroesophageal reflux disease)   . Arthritis    Past Surgical History  Procedure Laterality Date  . Whole back surgery for scioliosis   rods inserted ,with last surgery 2010    surgery done x 3 times--05/27/2005,05/2009,10/19/2009  . Right rotator cuff repair  09/07/1993  . Malignant melanoa removed from right shoulder   32yrs ago  . Melignant melanoma removed  from near left shin  6 yrs ago  . Bunion removed from left foot  50 yrs ago  . Lens implants both eyes  15 yrs ago  . Total knee arthroplasty  04/04/2011    Procedure: TOTAL KNEE ARTHROPLASTY;  Surgeon: [name], MD;  Location: [facility];  Service: Orthopedics;  Laterality: Left;  . Tonsillectomy  1936  . Appendectomy  1956    with suspension of uterus  . Tubal ligation  yrs ago     19  . Hernia repair  yrs ago    1958  . Cholecystectomy  yrs ago    8  . Eye surgery  12/1995,01/1996    bilateral cararacts with lens implants  . Hiatal hernia repair  01/09/2004    large paraesophageal  . Abdominal hysterectomy  1964- age 35    partial  . Back surgery  05/27/2005    for lumbar scoliotic deformity  . Breast surgery  2010    right breast-benign  . Anterior /posterior rectocele  07/12/1991    and ovarian cystectomy  . Total hip arthroplasty Left 10/23/2012    Procedure: LEFT TOTAL HIP ARTHROPLASTY ANTERIOR APPROACH;  Surgeon: [name], MD;  Location: [facility];  Service: Orthopedics;  Laterality: Left;   Social History:   reports that she has never smoked. She has never used smokeless tobacco. She reports that she does not drink alcohol or use illicit drugs.  Family History  Problem Relation Age of Onset  . Stroke Mother   . Hypertension Mother     Medications:   Medication List       This list is accurate as of: 02/18/15 11:54 AM.  Always use your most recent med list.               buPROPion 150 MG 24 hr tablet  Commonly known as:  WELLBUTRIN XL  Take 150 mg by mouth daily.     ciprofloxacin 500 MG tablet  Commonly known as:  CIPRO  Take 1 tablet (500 mg total) by mouth 2 (two) times daily. Please take for 2 days then stop     cycloSPORINE 0.05 % ophthalmic emulsion  Commonly known as:  RESTASIS  Place 1 drop into both eyes every 12 (twelve) hours. Patient has not had in a few months but says she needs some now.     diazepam 2 MG tablet  Commonly known as:  VALIUM  Take 1 tablet (2 mg total) by mouth every 8 (eight) hours as needed for anxiety. For anxiety     fentaNYL 50 MCG/HR  Commonly known as:  DURAGESIC - dosed mcg/hr  Place 1 patch (50 mcg total) onto the skin every 3 (three) days. DUE FOR ONE NOW     gabapentin 100 MG capsule  Commonly known as:  NEURONTIN  Take 100 mg by mouth 2 (two) times daily.      HYDROcodone-acetaminophen 7.5-325 MG tablet  Commonly known as:  NORCO  Take 1 tablet by mouth every 6 (six) hours as needed for moderate pain.     levothyroxine 100 MCG tablet  Commonly known as:  SYNTHROID  Take 1 tablet (100 mcg total) by mouth daily before breakfast.     pantoprazole 40 MG tablet  Commonly known as:  PROTONIX  Take 40 mg by mouth daily.     senna-docusate 8.6-50 MG tablet  Commonly known as:  Senokot-S  Take 1 tablet by mouth at bedtime as needed for mild constipation.  UNABLE TO FIND  Med Name: Med pass. Take 120 mL by mouth twice daily for supplement         Physical Exam: Filed Vitals:   02/18/15 1145  BP: 126/59  Pulse: 74  Temp: 98.9 F (37.2 C)  TempSrc: Oral  Resp: 18  Height: 4\' 8"  (1.422 m)  Weight: 123 lb (55.792 kg)  SpO2: 97%   Body mass index is 27.59 kg/(m^2).  General- elderly female, well built, in no acute distress Head- normocephalic, atraumatic Nose- no maxillary or frontal sinus tenderness, no nasal discharge Throat- moist mucus membrane  Eyes- PERRLA, EOMI, no pallor, no icterus, no discharge, normal conjunctiva, normal sclera Neck- no cervical lymphadenopathy Cardiovascular- normal s1,s2, no murmurs, no leg edema Respiratory- bilateral clear to auscultation, no wheeze, no rhonchi, no crackles, no use of accessory muscles Abdomen- bowel sounds present, soft, non tender Musculoskeletal- able to move all 4 extremities, generalized weakness. Arthritis changes to her fingers Neurological- no focal deficit, alert and oriented to person, place and time Skin- warm and dry Psychiatry- normal mood and affect    Labs reviewed: Basic Metabolic Panel:  Recent Labs  16/10/96 1959 02/16/15 02/16/15 0448 02/17/15 0424  NA 139 136* 136 136  K 3.6  --  3.4* 4.5  CL 94*  --  97* 102  CO2 31  --  31 30  GLUCOSE 94  --  115* 108*  BUN 21* 22* 24* 22*  CREATININE 1.71* 0.6 1.23* 0.58  CALCIUM 10.3  --  8.5* 8.5*   Liver  Function Tests:  Recent Labs  02/15/15 1959 02/16/15 0448  AST 35 23  ALT 15 13*  ALKPHOS 57 43  BILITOT 0.5 0.2*  PROT 6.4* 4.8*  ALBUMIN 3.8 2.9*   No results for input(s): LIPASE, AMYLASE in the last 8760 hours. No results for input(s): AMMONIA in the last 8760 hours. CBC:  Recent Labs  02/15/15 1959 02/16/15 02/16/15 0448  WBC 11.5* 6.8 6.8  HGB 12.4  --  10.2*  HCT 36.8  --  30.9*  MCV 92.9  --  96.3  PLT 258  --  209   Cardiac Enzymes:  Recent Labs  02/15/15 1959 02/16/15 0448  CKTOTAL 294* 186   BNP: Invalid input(s): POCBNP CBG:  Recent Labs  02/15/15 2001  GLUCAP 80    Radiological Exams: Dg Thoracic Spine 2 View  02/16/2015  CLINICAL DATA:  Back pain after falling out of bed several days ago. EXAM: THORACIC SPINE 2 VIEWS COMPARISON:  February 15, 2015. FINDINGS: Status post extensive surgical posterior fusion extending from mid thoracic spine into lumbar spine which is unchanged compared to prior exam. Stable moderate compression deformity of T5 vertebral body. No acute fracture or spondylolisthesis is noted. IMPRESSION: Extensive postsurgical changes as described above. Multi 5 compression deformity is noted. No acute abnormality seen. Electronically Signed   By: Lupita Raider, M.D.   On: 02/16/2015 13:46   Dg Thoracic Spine W/swimmers  02/15/2015  CLINICAL DATA:  82 year old female with back pain after falling out of bed. History of prior back surgery. EXAM: THORACIC SPINE - 3 VIEWS COMPARISON:  Radiograph dated 12/23/2012 FINDINGS: No acute fracture or subluxation. The bones are osteopenic. There is mild compression deformity and anterior wedging of an upper thoracic vertebra similar to prior study. Thoracolumbar fixation hardware noted. IMPRESSION: No acute fracture or subluxation. Electronically Signed   By: Elgie Collard M.D.   On: 02/15/2015 20:54   Dg Lumbar Spine 2-3 Views  02/16/2015  CLINICAL DATA:  Left-sided low back pain after falling out  of bed several days ago. EXAM: LUMBAR SPINE - 2-3 VIEW COMPARISON:  November 08, 2004. FINDINGS: Status post surgical posterior fusion extending from thoracic spine and through lumbar spine into S1 and S2. Good alignment of vertebral bodies is noted. No acute fracture or spondylolisthesis is noted. Degenerative disc disease is noted at multiple levels. IMPRESSION: Extensive postsurgical changes as described above. No acute abnormality seen. Electronically Signed   By: Lupita Raider, M.D.   On: 02/16/2015 13:48   Dg Pelvis 1-2 Views  02/16/2015  CLINICAL DATA:  Left hip pain after falling out of bed several days ago. EXAM: PELVIS - 1-2 VIEW COMPARISON:  October 23, 2012. FINDINGS: There is no evidence of pelvic fracture or diastasis. No pelvic bone lesions are seen. Status post left hip arthroplasty. Surgical posterior fusion of lumbar spine and upper sacrum is noted. IMPRESSION: No acute abnormality seen in the pelvis. Extensive postsurgical changes as described above. Electronically Signed   By: Lupita Raider, M.D.   On: 02/16/2015 13:49   Ct Head Wo Contrast  02/15/2015  CLINICAL DATA:  82 year old female with increased altered mental status and visual hallucinations. EXAM: CT HEAD WITHOUT CONTRAST TECHNIQUE: Contiguous axial images were obtained from the base of the skull through the vertex without intravenous contrast. COMPARISON:  None. FINDINGS: The ventricles are dilated and the sulci are prominent compatible with age-related atrophy. Moderate Periventricular and deep white matter hypodensities represent chronic microvascular ischemic changes. There is no intracranial hemorrhage. No mass effect or midline shift identified. There is mild mucoperiosteal thickening of the right maxillary sinus. The remainder of the paranasal sinuses for. Dilated. Multiple opacified mastoid air cells are seen bilaterally. The remainder of the visualized mastoid air cells are clear. The calvarium is intact. IMPRESSION: No  acute intracranial hemorrhage. Age-related atrophy and chronic microvascular ischemic disease. If symptoms persist and there are no contraindications, MRI may provide better evaluation if clinically indicated. Electronically Signed   By: Elgie Collard M.D.   On: 02/15/2015 20:41   Dg Hip Infant Unilat With Pelvis 2-3 Views Left  02/15/2015  CLINICAL DATA:  Initial evaluation for acute left hip pain status post fall. EXAM: DG HIP (WITH OR WITHOUT PELVIS) INFANT 2-3V LEFT COMPARISON:  None. FINDINGS: Left hip arthroplasty in place. The acetabular and femoral components articulate normally with 1 another. No periprosthetic lucency to suggest loosening or infection. No fracture or dislocation. Limited views of the pelvis demonstrate no acute abnormality. Fixation hardware present within the lumbosacral spine. No acute soft tissue abnormality. IMPRESSION: 1. No acute fracture or dislocation. 2. Left total hip arthroplasty in place.  No hardware complication. Electronically Signed   By: Rise Mu M.D.   On: 02/15/2015 20:57    Assessment/Plan  Generalized weakness Will have her work with physical therapy and occupational therapy team to help with gait training and muscle strengthening exercises.fall precautions. Skin care. Encourage to be out of bed.   UTI Asymptomatic. Continue and complete ciprofloxacin on 02/19/15. Maintain hydration  Acute renal impairment Thought to be from dehydration nad bp med, currently her lasix, hctz and ACEI are on hold. BP reading stable. Monitor renal function  HTN Off lasix, ACEI and HCTZ. Continue to monitor BP bid and introduce bp medication if  Needed  Hypothyroidism Continue synthroid and monitor  gerd With hiatal hernia. Continue protonix home regimen, no changes made  Constipation With hx of external hemorrhoids. Change senoko  s to 2 tab qhs for now with miralax daily as needed and monitor  Protein calorie malnutrition Monitor po intake, get  dietary consult, get weekly weight  Anemia Unspecified, monitor cbc  Chronic depression Continue home regimen wellbutrin  OA S/p left THA and left TKA. Continue fentanyl patch and neurontin and monitor  Goals of care: short term rehabilitation   Labs/tests ordered: cbc, cmp, 02/23/15  Family/ staff Communication: reviewed care plan with patient and nursing supervisor    Oneal Grout, MD Internal Medicine [facility] [address] [place], [place] [address] Cell Phone (Monday-Friday 8 am - 5 pm): [phone] On Call: [phone] and follow prompts after 5 pm and on weekends Office Phone: [phone] Office Fax: [phone]

## 2015-02-23 LAB — CBC AND DIFFERENTIAL
HCT: 31 % — AB (ref 36–46)
Hemoglobin: 10.3 g/dL — AB (ref 12.0–16.0)
NEUTROS ABS: 4 /uL
Platelets: 262 10*3/uL (ref 150–399)
WBC: 7.2 10^3/mL

## 2015-02-23 LAB — HEPATIC FUNCTION PANEL
ALT: 9 U/L (ref 7–35)
AST: 13 U/L (ref 13–35)
Alkaline Phosphatase: 53 U/L (ref 25–125)
BILIRUBIN, TOTAL: 0.3 mg/dL

## 2015-02-23 LAB — BASIC METABOLIC PANEL
BUN: 8 mg/dL (ref 4–21)
CREATININE: 0.7 mg/dL (ref 0.5–1.1)
GLUCOSE: 90 mg/dL
POTASSIUM: 4.4 mmol/L (ref 3.4–5.3)
SODIUM: 135 mmol/L — AB (ref 137–147)

## 2015-03-10 ENCOUNTER — Encounter: Payer: Self-pay | Admitting: Adult Health

## 2015-03-10 ENCOUNTER — Non-Acute Institutional Stay (SKILLED_NURSING_FACILITY): Payer: Medicare Other | Admitting: Adult Health

## 2015-03-10 DIAGNOSIS — K5900 Constipation, unspecified: Secondary | ICD-10-CM | POA: Diagnosis not present

## 2015-03-10 DIAGNOSIS — F329 Major depressive disorder, single episode, unspecified: Secondary | ICD-10-CM | POA: Diagnosis not present

## 2015-03-10 DIAGNOSIS — M159 Polyosteoarthritis, unspecified: Secondary | ICD-10-CM | POA: Diagnosis not present

## 2015-03-10 DIAGNOSIS — K449 Diaphragmatic hernia without obstruction or gangrene: Secondary | ICD-10-CM | POA: Diagnosis not present

## 2015-03-10 DIAGNOSIS — E038 Other specified hypothyroidism: Secondary | ICD-10-CM | POA: Diagnosis not present

## 2015-03-10 DIAGNOSIS — R531 Weakness: Secondary | ICD-10-CM

## 2015-03-10 DIAGNOSIS — E46 Unspecified protein-calorie malnutrition: Secondary | ICD-10-CM

## 2015-03-10 DIAGNOSIS — F419 Anxiety disorder, unspecified: Secondary | ICD-10-CM | POA: Diagnosis not present

## 2015-03-10 DIAGNOSIS — K219 Gastro-esophageal reflux disease without esophagitis: Secondary | ICD-10-CM

## 2015-03-10 DIAGNOSIS — D649 Anemia, unspecified: Secondary | ICD-10-CM | POA: Diagnosis not present

## 2015-03-10 DIAGNOSIS — F32A Depression, unspecified: Secondary | ICD-10-CM

## 2015-03-10 DIAGNOSIS — I10 Essential (primary) hypertension: Secondary | ICD-10-CM

## 2015-03-10 NOTE — Progress Notes (Signed)
Patient ID: Meghan Sexton, female   DOB: January 31, 1923, 82 y.o.   MRN: [id number]    DATE:  03/10/2015   MRN:  [id number]  BIRTHDAY: 1923/02/04  Facility:  Nursing Home Location:  [facility]  Nursing Home Room Number: 1206-P  LEVEL OF CARE:  SNF [phone])  Contact Information    Name Relation Home Work Mobile   [place] Grandaughter [phone]  [phone]   [place],Charles Son [phone]  [phone]   Summer,Jennifer Grandaughter   [phone]       Code Status History    Date Active Date Inactive Code Status Order ID Comments User Context   02/16/2015  1:01 AM 02/17/2015  6:42 PM Full Code [id number]  Alberteen Sam, MD Inpatient   12/20/2012  2:28 AM 12/24/2012  8:41 PM Full Code [id number]  Jarome Matin, MD Inpatient   10/23/2012 10:31 AM 10/26/2012  5:05 PM Full Code [id number]  Thyra Breed, PA-C Inpatient   04/04/2011  3:21 PM 04/07/2011  5:42 PM Full Code [id number]  Lynelle Doctor, RN Inpatient       Chief Complaint  Patient presents with  . Discharge Note    HISTORY OF PRESENT ILLNESS:  This is an 82 year old female who is for discharge home with home health PT for endurance, OT for ADLs and CNA for showers. She has been admitted to [facility] on 02/17/15 from [facility]. She has PMH of HTN, COPD, hypothyroidism and constipation. She had a fall and generalized weakness with altered mental status. She was diagnosed with UTI and acute renal failure. She received antibiotics and IV fluids. Her BP medications were held. Ct scan of brain was negative for acute findings.  Patient was admitted to this facility for short-term rehabilitation after the patient's recent hospitalization.  Patient has completed SNF rehabilitation and therapy has cleared the patient for discharge.  PAST MEDICAL HISTORY:  Past Medical History  Diagnosis Date  . Hypertension   . Asthma   . Hypothyroidism   . Constipation   . Blood transfusion 2010      1 unit after each back surgery  . Sleep apnea     stopbang=4  . Anxiety   . Depression   . GERD (gastroesophageal reflux disease)   . Arthritis      CURRENT MEDICATIONS: Reviewed  Patient's Medications  New Prescriptions   No medications on file  Previous Medications   BUPROPION (WELLBUTRIN XL) 150 MG 24 HR TABLET    Take 150 mg by mouth daily.   CYCLOSPORINE (RESTASIS) 0.05 % OPHTHALMIC EMULSION    Place 1 drop into both eyes every 12 (twelve) hours. Patient has not had in a few months but says she needs some now.   DIAZEPAM (VALIUM) 2 MG TABLET    Take 1 tablet (2 mg total) by mouth every 8 (eight) hours as needed for anxiety. For anxiety   FENTANYL (DURAGESIC - DOSED MCG/HR) 50 MCG/HR    Place 1 patch (50 mcg total) onto the skin every 3 (three) days. DUE FOR ONE NOW   GABAPENTIN (NEURONTIN) 100 MG CAPSULE    Take 100 mg by mouth 2 (two) times daily.   HYDROCODONE-ACETAMINOPHEN (NORCO) 7.5-325 MG TABLET    Take 1 tablet by mouth every 6 (six) hours as needed for moderate pain.   LEVOTHYROXINE (SYNTHROID) 100 MCG TABLET    Take 1 tablet (100 mcg total) by mouth daily before breakfast.   PANTOPRAZOLE (PROTONIX) 40 MG TABLET  Take 40 mg by mouth daily.    POLYETHYLENE GLYCOL (MIRALAX / GLYCOLAX) PACKET    Take 17 g by mouth daily as needed.    PROTEIN (PROCEL) POWD    Take 1 scoop by mouth 2 (two) times daily.   SENNOSIDES-DOCUSATE SODIUM (SENOKOT-S) 8.6-50 MG TABLET    Take 2 tablets by mouth at bedtime.   UNABLE TO FIND    Med Name: MedPass. Take 120 mL by mouth three times daily for supplement  Modified Medications   No medications on file  Discontinued Medications   SENNA-DOCUSATE (SENOKOT-S) 8.6-50 MG TABLET    Take 1 tablet by mouth at bedtime as needed for mild constipation.     Allergies  Allergen Reactions  . Codeine Nausea And Vomiting  . Lactose Nausea And Vomiting    GI upset - can tolerate some milk products  . Penicillins Other (See Comments)    Nodules in  legs Has patient had a PCN reaction causing immediate rash, facial/tongue/throat swelling, SOB or lightheadedness with hypotension: No Has patient had a PCN reaction causing severe rash involving mucus membranes or skin necrosis: No Has patient had a PCN reaction that required hospitalization No Has patient had a PCN reaction occurring within the last 10 years: No If all of the above answers are "NO", then may proceed with Cephalosporin use.  . Sulfonamide Derivatives Nausea Only     REVIEW OF SYSTEMS:  GENERAL: no change in appetite, no fatigue, no weight changes, no fever, chills or weakness EYES: Denies change in vision, dry eyes, eye pain, itching or discharge EARS: Denies change in hearing, ringing in ears, or earache NOSE: Denies nasal congestion or epistaxis MOUTH and THROAT: Denies oral discomfort, gingival pain or bleeding, pain from teeth or hoarseness   RESPIRATORY: no cough, SOB, DOE, wheezing, hemoptysis CARDIAC: no chest pain, edema or palpitations GI: no abdominal pain, diarrhea, constipation, heart burn, nausea or vomiting GU: Denies dysuria, frequency, hematuria, incontinence, or discharge PSYCHIATRIC: Denies feeling of depression or anxiety. No report of hallucinations, insomnia, paranoia, or agitation   PHYSICAL EXAMINATION  GENERAL APPEARANCE: Well nourished. In no acute distress. Normal body habitus SKIN:  Skin is warm and dry.  HEAD: Normal in size and contour. No evidence of trauma EYES: Lids open and close normally. No blepharitis, entropion or ectropion. PERRL. Conjunctivae are clear and sclerae are white. Lenses are without opacity EARS: Pinnae are normal. Patient hears normal voice tunes of the examiner MOUTH and THROAT: Lips are without lesions. Oral mucosa is moist and without lesions. Tongue is normal in shape, size, and color and without lesions NECK: supple, trachea midline, no neck masses, no thyroid tenderness, no thyromegaly LYMPHATICS: no LAN in  the neck, no supraclavicular LAN RESPIRATORY: breathing is even & unlabored, BS CTAB CARDIAC: RRR, no murmur,no extra heart sounds, no edema GI: abdomen soft, normal BS, no masses, no tenderness, no hepatomegaly, no splenomegaly EXTREMITIES:  Able to move 4 extremities PSYCHIATRIC: Alert and oriented X 3. Affect and behavior are appropriate  LABS/RADIOLOGY: Labs reviewed: Basic Metabolic Panel:  Recent Labs  16/10/96 1959  02/16/15 0448 02/17/15 0424 02/23/15  NA 139  < > 136 136 135*  K 3.6  --  3.4* 4.5 4.4  CL 94*  --  97* 102  --   CO2 31  --  31 30  --   GLUCOSE 94  --  115* 108*  --   BUN 21*  < > 24* 22* 8  CREATININE 1.71*  < >  1.23* 0.58 0.7  CALCIUM 10.3  --  8.5* 8.5*  --   < > = values in this interval not displayed. Liver Function Tests:  Recent Labs  02/15/15 1959 02/16/15 0448 02/23/15  AST 35 23 13  ALT 15 13* 9  ALKPHOS 57 43 53  BILITOT 0.5 0.2*  --   PROT 6.4* 4.8*  --   ALBUMIN 3.8 2.9*  --    CBC:  Recent Labs  02/15/15 1959 02/16/15 02/16/15 0448 02/23/15  WBC 11.5* 6.8 6.8 7.2  NEUTROABS  --   --   --  4  HGB 12.4  --  10.2* 10.3*  HCT 36.8  --  30.9* 31*  MCV 92.9  --  96.3  --   PLT 258  --  209 262   Cardiac Enzymes:  Recent Labs  02/15/15 1959 02/16/15 0448  CKTOTAL 294* 186   CBG:  Recent Labs  02/15/15 2001  GLUCAP 80      Dg Thoracic Spine 2 View  02/16/2015  CLINICAL DATA:  Back pain after falling out of bed several days ago. EXAM: THORACIC SPINE 2 VIEWS COMPARISON:  February 15, 2015. FINDINGS: Status post extensive surgical posterior fusion extending from mid thoracic spine into lumbar spine which is unchanged compared to prior exam. Stable moderate compression deformity of T5 vertebral body. No acute fracture or spondylolisthesis is noted. IMPRESSION: Extensive postsurgical changes as described above. Multi 5 compression deformity is noted. No acute abnormality seen. Electronically Signed   By: Lupita Raider,  M.D.   On: 02/16/2015 13:46   Dg Thoracic Spine W/swimmers  02/15/2015  CLINICAL DATA:  82 year old female with back pain after falling out of bed. History of prior back surgery. EXAM: THORACIC SPINE - 3 VIEWS COMPARISON:  Radiograph dated 12/23/2012 FINDINGS: No acute fracture or subluxation. The bones are osteopenic. There is mild compression deformity and anterior wedging of an upper thoracic vertebra similar to prior study. Thoracolumbar fixation hardware noted. IMPRESSION: No acute fracture or subluxation. Electronically Signed   By: Elgie Collard M.D.   On: 02/15/2015 20:54   Dg Lumbar Spine 2-3 Views  02/16/2015  CLINICAL DATA:  Left-sided low back pain after falling out of bed several days ago. EXAM: LUMBAR SPINE - 2-3 VIEW COMPARISON:  November 08, 2004. FINDINGS: Status post surgical posterior fusion extending from thoracic spine and through lumbar spine into S1 and S2. Good alignment of vertebral bodies is noted. No acute fracture or spondylolisthesis is noted. Degenerative disc disease is noted at multiple levels. IMPRESSION: Extensive postsurgical changes as described above. No acute abnormality seen. Electronically Signed   By: Lupita Raider, M.D.   On: 02/16/2015 13:48   Dg Pelvis 1-2 Views  02/16/2015  CLINICAL DATA:  Left hip pain after falling out of bed several days ago. EXAM: PELVIS - 1-2 VIEW COMPARISON:  October 23, 2012. FINDINGS: There is no evidence of pelvic fracture or diastasis. No pelvic bone lesions are seen. Status post left hip arthroplasty. Surgical posterior fusion of lumbar spine and upper sacrum is noted. IMPRESSION: No acute abnormality seen in the pelvis. Extensive postsurgical changes as described above. Electronically Signed   By: Lupita Raider, M.D.   On: 02/16/2015 13:49   Ct Head Wo Contrast  02/15/2015  CLINICAL DATA:  82 year old female with increased altered mental status and visual hallucinations. EXAM: CT HEAD WITHOUT CONTRAST TECHNIQUE: Contiguous axial  images were obtained from the base of the skull through the vertex without  intravenous contrast. COMPARISON:  None. FINDINGS: The ventricles are dilated and the sulci are prominent compatible with age-related atrophy. Moderate Periventricular and deep white matter hypodensities represent chronic microvascular ischemic changes. There is no intracranial hemorrhage. No mass effect or midline shift identified. There is mild mucoperiosteal thickening of the right maxillary sinus. The remainder of the paranasal sinuses for. Dilated. Multiple opacified mastoid air cells are seen bilaterally. The remainder of the visualized mastoid air cells are clear. The calvarium is intact. IMPRESSION: No acute intracranial hemorrhage. Age-related atrophy and chronic microvascular ischemic disease. If symptoms persist and there are no contraindications, MRI may provide better evaluation if clinically indicated. Electronically Signed   By: Elgie Collard M.D.   On: 02/15/2015 20:41   Dg Hip Infant Unilat With Pelvis 2-3 Views Left  02/15/2015  CLINICAL DATA:  Initial evaluation for acute left hip pain status post fall. EXAM: DG HIP (WITH OR WITHOUT PELVIS) INFANT 2-3V LEFT COMPARISON:  None. FINDINGS: Left hip arthroplasty in place. The acetabular and femoral components articulate normally with 1 another. No periprosthetic lucency to suggest loosening or infection. No fracture or dislocation. Limited views of the pelvis demonstrate no acute abnormality. Fixation hardware present within the lumbosacral spine. No acute soft tissue abnormality. IMPRESSION: 1. No acute fracture or dislocation. 2. Left total hip arthroplasty in place.  No hardware complication. Electronically Signed   By: Rise Mu M.D.   On: 02/15/2015 20:57    ASSESSMENT/PLAN:   UTI - resolved  Acute renal impairment - creatinine 0.66; resolved  Hypertension - off Lasix, ACE1 and HCTZ; BP stable, 122/67  Hypothyroidism - continue levothyroxine 100  g 1 tab by mouth daily  GERD - with hiatal hernia; continue Protonix 40 mg daily  Constipation - continue MiraLAX when necessary and senna S2 tabs by mouth daily at bedtime  Protein calorie malnutrition - albumin 3.19; continue Procel 1 scoop by mouth twice a day  Anemia, unspecified - hemoglobin 10.3; stable  Chronic depression - mood is stable; continue bupropion HCl XL 150 mg 1 tab by mouth daily  Osteoarthritis S/P left total hip arthroplasty and left total knee arthroplasty - continue Norco 7.5/325 mg 1 tab by mouth every 6 hours when necessary and fentanyl patch 50 g/hour 1 patch onto skin every 3 days  Anxiety - continue diazepam 2 mg 1 tab by mouth every 8 hours when necessary       I have filled out patient's discharge paperwork and written prescriptions.  Patient will receive home health PT, OT and CNA.  Total discharge time: Less than 30 minutes  Discharge time involved coordination of the discharge process with Child psychotherapist, nursing staff and therapy department. Medical justification for home health services verified.      [facility], NP [employer] [phone]

## 2015-03-26 ENCOUNTER — Other Ambulatory Visit (HOSPITAL_COMMUNITY): Payer: Self-pay | Admitting: Internal Medicine

## 2015-03-26 DIAGNOSIS — R011 Cardiac murmur, unspecified: Secondary | ICD-10-CM

## 2015-04-01 ENCOUNTER — Other Ambulatory Visit (HOSPITAL_COMMUNITY): Payer: Medicare Other

## 2015-04-17 ENCOUNTER — Other Ambulatory Visit (HOSPITAL_COMMUNITY): Payer: Medicare Other

## 2015-05-08 ENCOUNTER — Ambulatory Visit (HOSPITAL_COMMUNITY): Payer: Medicare Other

## 2015-05-22 ENCOUNTER — Ambulatory Visit (HOSPITAL_COMMUNITY): Payer: Medicare Other

## 2015-06-04 ENCOUNTER — Other Ambulatory Visit (HOSPITAL_COMMUNITY): Payer: Medicare Other

## 2015-06-12 ENCOUNTER — Telehealth: Payer: Self-pay | Admitting: *Deleted

## 2015-06-12 NOTE — Telephone Encounter (Signed)
Meghan Sexton cancel her echocardiogram on 3/22,4/7,4/28,5/12 and no showed on 06/04/15.

## 2015-09-28 ENCOUNTER — Other Ambulatory Visit: Payer: Self-pay | Admitting: Adult Health

## 2015-11-06 ENCOUNTER — Other Ambulatory Visit: Payer: Self-pay | Admitting: Adult Health

## 2016-05-02 ENCOUNTER — Ambulatory Visit (HOSPITAL_COMMUNITY)
Admission: RE | Admit: 2016-05-02 | Discharge: 2016-05-02 | Disposition: A | Discharge status: Home / Self Care | Payer: Medicare Other | Source: Ambulatory Visit | Attending: Internal Medicine | Admitting: Internal Medicine

## 2016-11-02 ENCOUNTER — Ambulatory Visit (HOSPITAL_COMMUNITY)
Admission: RE | Admit: 2016-11-02 | Discharge: 2016-11-02 | Disposition: A | Discharge status: Home / Self Care | Payer: Medicare Other | Source: Ambulatory Visit | Attending: Internal Medicine | Admitting: Internal Medicine

## 2016-11-19 ENCOUNTER — Emergency Department (HOSPITAL_BASED_OUTPATIENT_CLINIC_OR_DEPARTMENT_OTHER)
Admission: EM | Admit: 2016-11-19 | Discharge: 2016-11-19 | Disposition: A | Discharge status: Home / Self Care | Payer: Medicare Other | Attending: Emergency Medicine | Admitting: Emergency Medicine

## 2016-11-19 ENCOUNTER — Encounter (HOSPITAL_BASED_OUTPATIENT_CLINIC_OR_DEPARTMENT_OTHER): Payer: Self-pay | Admitting: Emergency Medicine

## 2016-11-19 ENCOUNTER — Emergency Department (HOSPITAL_BASED_OUTPATIENT_CLINIC_OR_DEPARTMENT_OTHER): Payer: Medicare Other

## 2016-11-19 ENCOUNTER — Other Ambulatory Visit: Payer: Self-pay

## 2016-11-19 DIAGNOSIS — E039 Hypothyroidism, unspecified: Secondary | ICD-10-CM | POA: Insufficient documentation

## 2016-11-19 DIAGNOSIS — J45909 Unspecified asthma, uncomplicated: Secondary | ICD-10-CM | POA: Insufficient documentation

## 2016-11-19 DIAGNOSIS — R630 Anorexia: Secondary | ICD-10-CM | POA: Diagnosis not present

## 2016-11-19 DIAGNOSIS — J449 Chronic obstructive pulmonary disease, unspecified: Secondary | ICD-10-CM | POA: Insufficient documentation

## 2016-11-19 DIAGNOSIS — Z79899 Other long term (current) drug therapy: Secondary | ICD-10-CM | POA: Insufficient documentation

## 2016-11-19 DIAGNOSIS — I10 Essential (primary) hypertension: Secondary | ICD-10-CM | POA: Diagnosis not present

## 2016-11-19 DIAGNOSIS — K625 Hemorrhage of anus and rectum: Secondary | ICD-10-CM | POA: Diagnosis present

## 2016-11-19 DIAGNOSIS — K644 Residual hemorrhoidal skin tags: Secondary | ICD-10-CM

## 2016-11-19 LAB — URINALYSIS, ROUTINE W REFLEX MICROSCOPIC
GLUCOSE, UA: NEGATIVE mg/dL
Hgb urine dipstick: NEGATIVE
Ketones, ur: NEGATIVE mg/dL
Nitrite: NEGATIVE
Protein, ur: NEGATIVE mg/dL
SPECIFIC GRAVITY, URINE: 1.01 (ref 1.005–1.030)
pH: 7 (ref 5.0–8.0)

## 2016-11-19 LAB — COMPREHENSIVE METABOLIC PANEL
ALT: 12 U/L — AB (ref 14–54)
AST: 28 U/L (ref 15–41)
Albumin: 3.9 g/dL (ref 3.5–5.0)
Alkaline Phosphatase: 68 U/L (ref 38–126)
Anion gap: 9 (ref 5–15)
BILIRUBIN TOTAL: 0.7 mg/dL (ref 0.3–1.2)
BUN: 25 mg/dL — AB (ref 6–20)
CO2: 30 mmol/L (ref 22–32)
CREATININE: 0.91 mg/dL (ref 0.44–1.00)
Calcium: 10.4 mg/dL — ABNORMAL HIGH (ref 8.9–10.3)
Chloride: 96 mmol/L — ABNORMAL LOW (ref 101–111)
GFR calc Af Amer: >60 mL/min (ref >60)
GFR, EST NON AFRICAN AMERICAN: 54 mL/min — AB (ref >60)
GLUCOSE: 116 mg/dL — AB (ref 65–99)
Potassium: 3.6 mmol/L (ref 3.5–5.1)
Sodium: 135 mmol/L (ref 135–145)
Total Protein: 7.1 g/dL (ref 6.5–8.1)

## 2016-11-19 LAB — URINALYSIS, MICROSCOPIC (REFLEX)

## 2016-11-19 LAB — CBC
HCT: 36.8 % (ref 36.0–46.0)
HEMOGLOBIN: 12.6 g/dL (ref 12.0–15.0)
MCH: 31.9 pg (ref 26.0–34.0)
MCHC: 34.2 g/dL (ref 30.0–36.0)
MCV: 93.2 fL (ref 78.0–100.0)
PLATELETS: 260 10*3/uL (ref 150–400)
RBC: 3.95 MIL/uL (ref 3.87–5.11)
RDW: 12.6 % (ref 11.5–15.5)
WBC: 11.5 10*3/uL — AB (ref 4.0–10.5)

## 2016-11-19 LAB — TROPONIN I: Troponin I: <0.03 ng/mL (ref <0.03)

## 2016-11-19 LAB — LIPASE, BLOOD: LIPASE: 30 U/L (ref 11–51)

## 2016-11-19 LAB — OCCULT BLOOD X 1 CARD TO LAB, STOOL: Fecal Occult Bld: NEGATIVE

## 2016-11-19 LAB — I-STAT CG4 LACTIC ACID, ED: LACTIC ACID, VENOUS: 2.02 mmol/L — AB (ref 0.5–1.9)

## 2016-11-19 MED ORDER — SODIUM CHLORIDE 0.9 % IV BOLUS (SEPSIS)
1000.0000 mL | Freq: Once | INTRAVENOUS | Status: AC
  Administered 2016-11-19: 1000 mL via INTRAVENOUS

## 2016-11-19 NOTE — ED Notes (Signed)
LAC results of 2.02 hand delivered to Dr. Clarene DukeLittle @[phone] 

## 2016-11-19 NOTE — Discharge Instructions (Signed)
TAKE FIBER SUPPLEMENT AND COLACE WITH PLENTY OF WATER DAILY. USE MIRALAX AS NEEDED FOR CONSTIPATION. TREATING THE CONSTIPATION WILL HELP YOUR HEMORRHOIDS TO HEAL. SEE YOUR PRIMARY CARE PROVIDER THIS WEEK FOR RE-CHECK. RETURN TO ER IF YOU HAVE SEVERE BLEEDING, ABDOMINAL PAIN, FEVER.

## 2016-11-19 NOTE — ED Notes (Signed)
[facility]

## 2016-11-19 NOTE — ED Triage Notes (Addendum)
PT presents with c/o bloody diarrhea that started today. Granddaughter states patient has decreased appetite over the past couple weeks and that she feels patient has increasing weakness.

## 2016-11-19 NOTE — ED Notes (Signed)
Alert, NAD, calm, interactive, resps e/u, speaking in clear complete sentences, no dyspnea noted, skin W&D, VSS, here for rectal bleeding, reports "have hemorrhoids", (denies: pain, sob, fever, NVD, dizziness, "feeing unusually cold", weakness or visual changes), states, "feel normal".  [facility]

## 2016-11-19 NOTE — ED Provider Notes (Signed)
[facility] EMERGENCY DEPARTMENT Provider Note   CSN: [id number] Arrival date & time: 11/19/16  2001     History   Chief Complaint Chief Complaint  Patient presents with  . Rectal Bleeding    HPI Meghan Sexton is a 83 y.o. female.  83yo F w/ PMH including HTN, anxiety/depression, GERD who p/w rectal bleeding. Pt states today she began having bloody diarrhea and has had multiple episodes throughout the day since it began. She denies any associated abdominal pain. She has pain on her bottom because it is raw from wiping. She reports some dysuria that began today but thinks it may be related to the diarrhea. She denies any vomiting, fevers, sick contacts, or recent travel. Granddaughter reports several weeks of generalized weakness and decreased appetite.  She reports that she has had a cough recently but denies any associated shortness of breath or chest pain.   The history is provided by the patient.  Rectal Bleeding    Past Medical History:  Diagnosis Date  . Anxiety   . Arthritis   . Asthma   . Blood transfusion 2010    1 unit after each back surgery  . Constipation   . Depression   . GERD (gastroesophageal reflux disease)   . Hypertension   . Hypothyroidism   . Sleep apnea    stopbang=4    Patient Active Problem List   Diagnosis Date Noted  . Altered mental status 02/16/2015  . Altered mental state 02/16/2015  . Acute kidney injury (HCC) 02/15/2015  . Hyponatremia 12/24/2012  . CAP (community acquired pneumonia) 12/23/2012    Class: Acute  . Anemia 12/23/2012    Class: Chronic  . Hyperkalemia 12/23/2012    Class: Acute  . Influenza 12/20/2012  . Leukocytosis, unspecified 12/20/2012    Class: Acute  . Asthma 12/04/2012  . Insomnia 12/04/2012  . Hip arthritis 12/03/2012  . Acute posthemorrhagic anemia 12/03/2012  . Hypothyroidism 12/03/2012  . S/P left THA, AA 10/23/2012  . S/P Left TKA 04/04/2011  . Essential hypertension 03/12/2008  .  COPD without exacerbation (HCC) 03/12/2008  . CONSTIPATION 03/12/2008  . OTHER DYSPHAGIA 03/12/2008  . ESOPHAGEAL STRICTURE 03/23/2007  . DYSPHAGIA UNSPECIFIED 03/23/2007  . EPIGASTRIC PAIN 03/23/2007  . HEMORRHOIDS, EXTERNAL 11/01/2006  . GERD 11/01/2006  . HIATAL HERNIA 11/01/2006  . DIVERTICULOSIS, COLON 11/01/2006    Past Surgical History:  Procedure Laterality Date  . ABDOMINAL HYSTERECTOMY  1964- age 83   partial  . anterior /posterior rectocele  07/12/1991   and ovarian cystectomy  . APPENDECTOMY  1956   with suspension of uterus  . BACK SURGERY  05/27/2005   for lumbar scoliotic deformity  . BREAST SURGERY  2010   right breast-benign  . bunion removed from left foot  50 yrs ago  . CHOLECYSTECTOMY  yrs ago   [id number]  . EYE SURGERY  12/1995,01/1996   bilateral cararacts with lens implants  . HERNIA REPAIR  yrs ago   1958  . HIATAL HERNIA REPAIR  01/09/2004   large paraesophageal  . lens implants both eyes  15 yrs ago  . Malignant melanoma removed from right shoulder   1836yrs ago  . melignant melanoma removed from near left shin  6 yrs ago  . right rotator cuff repair  09/07/1993  . TONSILLECTOMY  1936  . TUBAL LIGATION  yrs ago   [id number]  . Whole back surgery for scoliosis   rods inserted ,with last surgery 2010   surgery  done x 3 times--05/27/2005,05/2009,10/19/2009    OB History    No data available       Home Medications    Prior to Admission medications   Medication Sig Start Date End Date Taking? Authorizing Provider  buPROPion (WELLBUTRIN XL) 150 MG 24 hr tablet Take 150 mg by mouth daily.    [provider]  cycloSPORINE (RESTASIS) 0.05 % ophthalmic emulsion Place 1 drop into both eyes every 12 (twelve) hours. Patient has not had in a few months but says she needs some now.    [provider]  diazepam (VALIUM) 2 MG tablet Take 1 tablet (2 mg total) by mouth every 8 (eight) hours as needed for anxiety. For anxiety 02/17/15   Elgergawy,  Leana Roeawood S, MD  fentaNYL (DURAGESIC - DOSED MCG/HR) 50 MCG/HR Place 1 patch (50 mcg total) onto the skin every 3 (three) days. DUE FOR ONE NOW 02/17/15   Elgergawy, Leana Roeawood S, MD  gabapentin (NEURONTIN) 100 MG capsule Take 100 mg by mouth 2 (two) times daily.    [provider]  HYDROcodone-acetaminophen (NORCO) 7.5-325 MG tablet Take 1 tablet by mouth every 6 (six) hours as needed for moderate pain. 02/17/15   Elgergawy, Leana Roeawood S, MD  levothyroxine (SYNTHROID) 100 MCG tablet Take 1 tablet (100 mcg total) by mouth daily before breakfast. 02/17/15   Elgergawy, Leana Roeawood S, MD  pantoprazole (PROTONIX) 40 MG tablet Take 40 mg by mouth daily.     [provider]  polyethylene glycol (MIRALAX / GLYCOLAX) packet Take 17 g by mouth daily as needed.     [provider]  Protein (PROCEL) POWD Take 1 scoop by mouth 2 (two) times daily.    [provider]  sennosides-docusate sodium (SENOKOT-S) 8.6-50 MG tablet Take 2 tablets by mouth at bedtime.    [provider]  UNABLE TO FIND Med Name: MedPass. Take 120 mL by mouth three times daily for supplement    [provider]    Family History Family History  Problem Relation Age of Onset  . Stroke Mother   . Hypertension Mother     Social History Social History   Tobacco Use  . Smoking status: Never Smoker  . Smokeless tobacco: Never Used  Substance Use Topics  . Alcohol use: No  . Drug use: No     Allergies   Codeine; Lactose; Penicillins; and Sulfonamide derivatives   Review of Systems Review of Systems  Gastrointestinal: Positive for hematochezia.   All other systems reviewed and are negative except that which was mentioned in HPI   Physical Exam Updated Vital Signs BP 126/61   Pulse (!) 58   Temp 98 F (36.7 C) (Oral)   Resp 19   Wt 44.9 kg (99 lb)   SpO2 100%   BMI 22.20 kg/m   Physical Exam  Constitutional: She is oriented to person, place, and time. She appears well-developed.  No distress.  Thin, frail elderly woman awake and comfortable  HENT:  Head: Normocephalic and atraumatic.  Mouth/Throat: Oropharynx is clear and moist.  Moist mucous membranes  Eyes: Conjunctivae are normal. Pupils are equal, round, and reactive to light.  Neck: Neck supple.  Cardiovascular: Normal rate, regular rhythm and normal heart sounds.  No murmur heard. Pulmonary/Chest: Effort normal and breath sounds normal.  Abdominal: Soft. Bowel sounds are normal. She exhibits no distension. There is no tenderness.  Genitourinary:  Genitourinary Comments: Brown stool in gluteal cleft, external hemorrhoid non-thrombosed and not bleeding, stool high in  rectal vault  Musculoskeletal: She exhibits no edema.  Neurological: She is alert and oriented to person, place, and time.  Fluent speech  Skin: Skin is warm and dry.  Psychiatric: She has a normal mood and affect. Judgment normal.  Nursing note and vitals reviewed. Chaperone was present during exam.    ED Treatments / Results  Labs (all labs ordered are listed, but only abnormal results are displayed) Labs Reviewed  COMPREHENSIVE METABOLIC PANEL - Abnormal; Notable for the following components:      Result Value   Chloride 96 (*)    Glucose, Bld 116 (*)    BUN 25 (*)    Calcium 10.4 (*)    ALT 12 (*)    GFR calc non Af Amer 54 (*)    All other components within normal limits  CBC - Abnormal; Notable for the following components:   WBC 11.5 (*)    All other components within normal limits  URINALYSIS, ROUTINE W REFLEX MICROSCOPIC - Abnormal; Notable for the following components:   Bilirubin Urine SMALL (*)    Leukocytes, UA SMALL (*)    All other components within normal limits  URINALYSIS, MICROSCOPIC (REFLEX) - Abnormal; Notable for the following components:   Bacteria, UA FEW (*)    Squamous Epithelial / LPF 0-5 (*)    All other components within normal limits  I-STAT CG4 LACTIC ACID, ED - Abnormal; Notable for the following  components:   Lactic Acid, Venous 2.02 (*)    All other components within normal limits  URINE CULTURE  LIPASE, BLOOD  TROPONIN I  OCCULT BLOOD X 1 CARD TO LAB, STOOL  I-STAT CG4 LACTIC ACID, ED    EKG  EKG Interpretation None       Radiology Dg Chest 2 View  Result Date: 11/19/2016 CLINICAL DATA:  Acute onset of cough and generalized weakness. Decreased appetite. Diarrhea. EXAM: CHEST  2 VIEW COMPARISON:  Chest radiograph performed 12/23/2012 FINDINGS: The lungs are well-aerated. Minimal scarring is noted at the right costophrenic angle. There is no evidence of focal opacification, pleural effusion or pneumothorax. The heart is normal in size; the mediastinal contour is within normal limits. No acute osseous abnormalities are seen. Thoracolumbar spinal fusion hardware is again noted. IMPRESSION: No acute cardiopulmonary process seen. Electronically Signed   By: Roanna Raider M.D.   On: 11/19/2016 21:40   Dg Abdomen 1 View  Result Date: 11/19/2016 CLINICAL DATA:  Bloody diarrhea.  History of constipation. EXAM: ABDOMEN - 1 VIEW COMPARISON:  None. FINDINGS: Surgical hardware associated with the lower thoracic and lumbar spine is stable. Left hip replacement. Bones otherwise unremarkable. The bowel gas pattern is unremarkable with no evidence of obstruction. No free air, portal venous gas, pneumatosis seen on supine imaging. IMPRESSION: Surgical hardware in the left hip and spine. The bowel gas pattern is unremarkable. No cause for the patient's symptoms identified. Electronically Signed   By: Gerome Sam III M.D   On: 11/19/2016 21:36    Procedures Procedures (including critical care time)  Medications Ordered in ED Medications  sodium chloride 0.9 % bolus 1,000 mL (1,000 mLs Intravenous New Bag/Given 11/19/16 2056)     Initial Impression / Assessment and Plan / ED Course  I have reviewed the triage vital signs and the nursing notes.  Pertinent labs & imaging results that  were available during my care of the patient were reviewed by me and considered in my medical decision making (see chart for details).  Well appearing w/ normal VS on exam. Abd non-tender. She had solid stool high in rectal vault, no impaction, stool hemoccult negative. Labs show lactate 2.02, BUN 25, Cr 0.91, WBC 11.5, Hgb 12.6 compared to previous ~10. Gave fluids based on elevated BUN and lactate slightly abnormal at 2.02. PT has long-standing history of constipation and granddaughter notes that initially she was incontinent of stool this morning.  I suspect she may have had some encopresis due to constipation and her bleeding may be related to external hemorrhoid.  She has no evidence of bleeding on exam.  I have discussed constipation treatment including Colace, fiber, water intake, and MiraLAX.  Instructed her to see her PCP for reassessment and further evaluation of her ongoing problems with poor appetite. As she has had no vomiting or abdominal pain I do not feel she needs imaging currently.  Family voiced understanding of return precautions and patient was discharged in satisfactory condition.  Final Clinical Impressions(s) / ED Diagnoses   Final diagnoses:  External hemorrhoid  Poor appetite    ED Discharge Orders    None       Little, Ambrose Finland, MD 11/19/16 2303

## 2016-11-19 NOTE — ED Notes (Signed)
No changes, back from xray.

## 2016-11-21 LAB — URINE CULTURE
Culture: <10000 — AB
SPECIAL REQUESTS: NORMAL

## 2018-10-04 ENCOUNTER — Ambulatory Visit: Payer: Medicare Other

## 2018-10-04 ENCOUNTER — Ambulatory Visit (INDEPENDENT_AMBULATORY_CARE_PROVIDER_SITE_OTHER): Payer: Medicare Other | Admitting: Podiatry

## 2018-10-04 ENCOUNTER — Encounter: Payer: Self-pay | Admitting: Podiatry

## 2018-10-04 ENCOUNTER — Other Ambulatory Visit: Payer: Self-pay

## 2018-10-04 VITALS — BP 148/58

## 2018-10-04 DIAGNOSIS — M79675 Pain in left toe(s): Secondary | ICD-10-CM | POA: Diagnosis not present

## 2018-10-04 DIAGNOSIS — M79674 Pain in right toe(s): Secondary | ICD-10-CM | POA: Diagnosis not present

## 2018-10-04 DIAGNOSIS — B351 Tinea unguium: Secondary | ICD-10-CM

## 2018-10-04 DIAGNOSIS — L84 Corns and callosities: Secondary | ICD-10-CM

## 2018-10-04 DIAGNOSIS — M2041 Other hammer toe(s) (acquired), right foot: Secondary | ICD-10-CM

## 2018-10-04 DIAGNOSIS — M79671 Pain in right foot: Secondary | ICD-10-CM

## 2018-10-04 DIAGNOSIS — M2042 Other hammer toe(s) (acquired), left foot: Secondary | ICD-10-CM

## 2018-10-04 DIAGNOSIS — M79672 Pain in left foot: Secondary | ICD-10-CM

## 2018-10-04 NOTE — Progress Notes (Signed)
Subjective:   Patient ID: Meghan Sexton, female   DOB: 85 y.o.   MRN: [id number]   HPI 85 year old female presents the office today for concerns of thick, painful, elongated toenails that she cannot trim herself particular on the right side starting to cause irritation to her second digit.  Hurts more after being on her feet all day.  Denies any redness or drainage to the toenail sites.  No recent injury.  No other concerns.   Review of Systems  All other systems reviewed and are negative.  Past Medical History:  Diagnosis Date  . Anxiety   . Arthritis   . Asthma   . Blood transfusion 2010    1 unit after each back surgery  . Constipation   . Depression   . GERD (gastroesophageal reflux disease)   . Hypertension   . Hypothyroidism   . Sleep apnea    stopbang=4    Past Surgical History:  Procedure Laterality Date  . ABDOMINAL HYSTERECTOMY  1964- age 13   partial  . anterior /posterior rectocele  07/12/1991   and ovarian cystectomy  . APPENDECTOMY  1956   with suspension of uterus  . BACK SURGERY  05/27/2005   for lumbar scoliotic deformity  . BREAST SURGERY  2010   right breast-benign  . bunion removed from left foot  50 yrs ago  . CHOLECYSTECTOMY  yrs ago   69  . EYE SURGERY  12/1995,01/1996   bilateral cararacts with lens implants  . HERNIA REPAIR  yrs ago   1958  . HIATAL HERNIA REPAIR  01/09/2004   large paraesophageal  . lens implants both eyes  15 yrs ago  . Malignant melanoma removed from right shoulder   58yrs ago  . melignant melanoma removed from near left shin  6 yrs ago  . right rotator cuff repair  09/07/1993  . TONSILLECTOMY  1936  . TOTAL HIP ARTHROPLASTY Left 10/23/2012   Procedure: LEFT TOTAL HIP ARTHROPLASTY ANTERIOR APPROACH;  Surgeon: [name], MD;  Location: [facility]S;  Service: Orthopedics;  Laterality: Left;  . TOTAL KNEE ARTHROPLASTY  04/04/2011   Procedure: TOTAL KNEE ARTHROPLASTY;  Surgeon: [name], MD;  Location: [facility] OR[facility];   Service: Orthopedics;  Laterality: Left;  . TUBAL LIGATION  yrs ago   15  . Whole back surgery for scoliosis   rods inserted ,with last surgery 2010   surgery done x 3 times--05/27/2005,05/2009,10/19/2009     Current Outpatient Medications:  .  buPROPion (WELLBUTRIN XL) 150 MG 24 hr tablet, Take 150 mg by mouth daily., Disp: , Rfl:  .  cycloSPORINE (RESTASIS) 0.05 % ophthalmic emulsion, Place 1 drop into both eyes every 12 (twelve) hours. Patient has not had in a few months but says she needs some now., Disp: , Rfl:  .  diazepam (VALIUM) 2 MG tablet, Take 1 tablet (2 mg total) by mouth every 8 (eight) hours as needed for anxiety. For anxiety, Disp: 20 tablet, Rfl: 0 .  fentaNYL (DURAGESIC - DOSED MCG/HR) 50 MCG/HR, Place 1 patch (50 mcg total) onto the skin every 3 (three) days. DUE FOR ONE NOW, Disp: 5 patch, Rfl: 0 .  gabapentin (NEURONTIN) 100 MG capsule, Take 100 mg by mouth 2 (two) times daily., Disp: , Rfl:  .  HYDROcodone-acetaminophen (NORCO) 7.5-325 MG tablet, Take 1 tablet by mouth every 6 (six) hours as needed for moderate pain., Disp: 15 tablet, Rfl: 0 .  levothyroxine (SYNTHROID) 100 MCG tablet, Take 1 tablet (  100 mcg total) by mouth daily before breakfast., Disp: , Rfl:  .  pantoprazole (PROTONIX) 40 MG tablet, Take 40 mg by mouth daily. , Disp: , Rfl:  .  polyethylene glycol (MIRALAX / GLYCOLAX) packet, Take 17 g by mouth daily as needed. , Disp: , Rfl:  .  Protein (PROCEL) POWD, Take 1 scoop by mouth 2 (two) times daily., Disp: , Rfl:  .  sennosides-docusate sodium (SENOKOT-S) 8.6-50 MG tablet, Take 2 tablets by mouth at bedtime., Disp: , Rfl:  .  UNABLE TO FIND, Med Name: MedPass. Take 120 mL by mouth three times daily for supplement, Disp: , Rfl:   Allergies  Allergen Reactions  . Codeine Nausea And Vomiting  . Lactose Nausea And Vomiting    GI upset - can tolerate some milk products  . Penicillins Other (See Comments)    Nodules in legs Has patient had a PCN reaction  causing immediate rash, facial/tongue/throat swelling, SOB or lightheadedness with hypotension: No Has patient had a PCN reaction causing severe rash involving mucus membranes or skin necrosis: No Has patient had a PCN reaction that required hospitalization No Has patient had a PCN reaction occurring within the last 10 years: No If all of the above answers are "NO", then may proceed with Cephalosporin use.  . Sulfonamide Derivatives Nausea Only          Objective:  Physical Exam  General: AAO x3, NAD  Dermatological: Bilateral hallux toenails are significantly hypertrophic, dystrophic, discolored.  Brittle with yellow-brown discoloration.  The nails are also hypertrophic, dystrophic with yellow-brown discoloration.  No redness or drainage to the toenail sites.  The right hallux nail starting to about the second toe causing skin irritation there is no definitive breakdown the skin today.  Vascular: Dorsalis Pedis artery and Posterior Tibial artery pedal pulses are 2/4 bilateral with immedate capillary fill time. There is no pain with calf compression, swelling, warmth, erythema.   Neruologic: Grossly intact via light touch bilateral.Protective threshold with Semmes Wienstein monofilament intact to all pedal sites bilateral.   Musculoskeletal: Hammertoe contractures present.       Assessment:   85 year old female with symptomatic onychomycosis resulting in vascular.    Plan:  -Treatment options discussed including all alternatives, risks, and complications -Etiology of symptoms were discussed -Nails debrided 10 without complications or bleeding. -Offloading pads dispensed. -Daily foot inspection -Follow-up in 3 months or sooner if any problems arise. In the meantime, encouraged to call the office with any questions, concerns, change in symptoms.   Celesta Gentile, DPM

## 2019-09-05 DIAGNOSIS — M810 Age-related osteoporosis without current pathological fracture: Secondary | ICD-10-CM | POA: Diagnosis not present

## 2019-09-05 DIAGNOSIS — E039 Hypothyroidism, unspecified: Secondary | ICD-10-CM | POA: Diagnosis not present

## 2019-09-05 DIAGNOSIS — E785 Hyperlipidemia, unspecified: Secondary | ICD-10-CM | POA: Diagnosis not present

## 2019-09-12 DIAGNOSIS — Z0000 Encounter for general adult medical examination without abnormal findings: Secondary | ICD-10-CM | POA: Diagnosis not present

## 2019-09-12 DIAGNOSIS — I10 Essential (primary) hypertension: Secondary | ICD-10-CM | POA: Diagnosis not present

## 2019-09-12 DIAGNOSIS — I700 Atherosclerosis of aorta: Secondary | ICD-10-CM | POA: Diagnosis not present

## 2019-09-12 DIAGNOSIS — J449 Chronic obstructive pulmonary disease, unspecified: Secondary | ICD-10-CM | POA: Diagnosis not present

## 2019-09-12 DIAGNOSIS — E46 Unspecified protein-calorie malnutrition: Secondary | ICD-10-CM | POA: Diagnosis not present

## 2019-09-12 DIAGNOSIS — F1120 Opioid dependence, uncomplicated: Secondary | ICD-10-CM | POA: Diagnosis not present

## 2019-09-12 DIAGNOSIS — M1990 Unspecified osteoarthritis, unspecified site: Secondary | ICD-10-CM | POA: Diagnosis not present

## 2019-09-12 DIAGNOSIS — R269 Unspecified abnormalities of gait and mobility: Secondary | ICD-10-CM | POA: Diagnosis not present

## 2019-09-12 DIAGNOSIS — F0390 Unspecified dementia without behavioral disturbance: Secondary | ICD-10-CM | POA: Diagnosis not present

## 2020-06-05 DIAGNOSIS — I77810 Thoracic aortic ectasia: Secondary | ICD-10-CM | POA: Diagnosis not present

## 2020-06-05 DIAGNOSIS — M1990 Unspecified osteoarthritis, unspecified site: Secondary | ICD-10-CM | POA: Diagnosis not present

## 2020-06-05 DIAGNOSIS — F0390 Unspecified dementia without behavioral disturbance: Secondary | ICD-10-CM | POA: Diagnosis not present

## 2020-06-05 DIAGNOSIS — R627 Adult failure to thrive: Secondary | ICD-10-CM | POA: Diagnosis not present

## 2020-06-05 DIAGNOSIS — F1120 Opioid dependence, uncomplicated: Secondary | ICD-10-CM | POA: Diagnosis not present

## 2020-06-05 DIAGNOSIS — E785 Hyperlipidemia, unspecified: Secondary | ICD-10-CM | POA: Diagnosis not present

## 2020-06-05 DIAGNOSIS — I700 Atherosclerosis of aorta: Secondary | ICD-10-CM | POA: Diagnosis not present

## 2020-06-05 DIAGNOSIS — R269 Unspecified abnormalities of gait and mobility: Secondary | ICD-10-CM | POA: Diagnosis not present

## 2020-06-05 DIAGNOSIS — E46 Unspecified protein-calorie malnutrition: Secondary | ICD-10-CM | POA: Diagnosis not present

## 2020-11-16 DIAGNOSIS — E039 Hypothyroidism, unspecified: Secondary | ICD-10-CM | POA: Diagnosis not present

## 2020-11-16 DIAGNOSIS — M810 Age-related osteoporosis without current pathological fracture: Secondary | ICD-10-CM | POA: Diagnosis not present

## 2020-11-16 DIAGNOSIS — E785 Hyperlipidemia, unspecified: Secondary | ICD-10-CM | POA: Diagnosis not present

## 2020-11-16 DIAGNOSIS — I10 Essential (primary) hypertension: Secondary | ICD-10-CM | POA: Diagnosis not present

## 2020-11-16 DIAGNOSIS — Z23 Encounter for immunization: Secondary | ICD-10-CM | POA: Diagnosis not present

## 2020-11-17 DIAGNOSIS — R82998 Other abnormal findings in urine: Secondary | ICD-10-CM | POA: Diagnosis not present

## 2020-11-17 DIAGNOSIS — I10 Essential (primary) hypertension: Secondary | ICD-10-CM | POA: Diagnosis not present

## 2020-11-23 DIAGNOSIS — E46 Unspecified protein-calorie malnutrition: Secondary | ICD-10-CM | POA: Diagnosis not present

## 2020-11-23 DIAGNOSIS — R1310 Dysphagia, unspecified: Secondary | ICD-10-CM | POA: Diagnosis not present

## 2020-11-23 DIAGNOSIS — Z0000 Encounter for general adult medical examination without abnormal findings: Secondary | ICD-10-CM | POA: Diagnosis not present

## 2020-11-23 DIAGNOSIS — M810 Age-related osteoporosis without current pathological fracture: Secondary | ICD-10-CM | POA: Diagnosis not present

## 2020-11-23 DIAGNOSIS — M1990 Unspecified osteoarthritis, unspecified site: Secondary | ICD-10-CM | POA: Diagnosis not present

## 2020-11-23 DIAGNOSIS — N1831 Chronic kidney disease, stage 3a: Secondary | ICD-10-CM | POA: Diagnosis not present

## 2020-11-23 DIAGNOSIS — R627 Adult failure to thrive: Secondary | ICD-10-CM | POA: Diagnosis not present

## 2020-11-23 DIAGNOSIS — F0390 Unspecified dementia without behavioral disturbance: Secondary | ICD-10-CM | POA: Diagnosis not present

## 2020-11-23 DIAGNOSIS — I10 Essential (primary) hypertension: Secondary | ICD-10-CM | POA: Diagnosis not present

## 2021-06-11 DIAGNOSIS — E46 Unspecified protein-calorie malnutrition: Secondary | ICD-10-CM | POA: Diagnosis not present

## 2021-06-11 DIAGNOSIS — L89152 Pressure ulcer of sacral region, stage 2: Secondary | ICD-10-CM | POA: Diagnosis not present

## 2021-06-11 DIAGNOSIS — M5489 Other dorsalgia: Secondary | ICD-10-CM | POA: Diagnosis not present

## 2021-06-11 DIAGNOSIS — F0390 Unspecified dementia without behavioral disturbance: Secondary | ICD-10-CM | POA: Diagnosis not present

## 2021-06-11 DIAGNOSIS — R269 Unspecified abnormalities of gait and mobility: Secondary | ICD-10-CM | POA: Diagnosis not present

## 2021-06-11 DIAGNOSIS — N1831 Chronic kidney disease, stage 3a: Secondary | ICD-10-CM | POA: Diagnosis not present

## 2021-06-11 DIAGNOSIS — M419 Scoliosis, unspecified: Secondary | ICD-10-CM | POA: Diagnosis not present

## 2021-06-11 DIAGNOSIS — F1120 Opioid dependence, uncomplicated: Secondary | ICD-10-CM | POA: Diagnosis not present

## 2021-06-11 DIAGNOSIS — I10 Essential (primary) hypertension: Secondary | ICD-10-CM | POA: Diagnosis not present

## 2021-09-30 DIAGNOSIS — R531 Weakness: Secondary | ICD-10-CM | POA: Diagnosis not present

## 2021-09-30 DIAGNOSIS — R404 Transient alteration of awareness: Secondary | ICD-10-CM | POA: Diagnosis not present

## 2021-10-10 DEATH — deceased
